# Patient Record
Sex: Male | Born: 1985 | Race: Black or African American | Hispanic: No | Marital: Single | Smoking: Never smoker
Health system: Southern US, Community
[De-identification: ages and names within clinical notes are randomized; demographics above are authoritative.]

## PROBLEM LIST (undated history)

## (undated) DIAGNOSIS — G4730 Sleep apnea, unspecified: Secondary | ICD-10-CM

## (undated) DIAGNOSIS — N200 Calculus of kidney: Secondary | ICD-10-CM

## (undated) DIAGNOSIS — I10 Essential (primary) hypertension: Secondary | ICD-10-CM

## (undated) HISTORY — PX: INGUINAL HERNIA REPAIR: SUR1180

## (undated) HISTORY — DX: Calculus of kidney: N20.0

## (undated) HISTORY — DX: Sleep apnea, unspecified: G47.30

## (undated) HISTORY — DX: Essential (primary) hypertension: I10

---
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
## 2006-08-27 ENCOUNTER — Emergency Department (HOSPITAL_COMMUNITY): Admission: EM | Admit: 2006-08-27 | Discharge: 2006-08-27 | Payer: Self-pay | Admitting: Emergency Medicine

## 2006-09-30 ENCOUNTER — Ambulatory Visit: Payer: Self-pay | Admitting: Nurse Practitioner

## 2006-10-01 ENCOUNTER — Ambulatory Visit: Payer: Self-pay | Admitting: *Deleted

## 2007-02-10 ENCOUNTER — Ambulatory Visit: Payer: Self-pay | Admitting: Nurse Practitioner

## 2007-07-29 ENCOUNTER — Ambulatory Visit: Payer: Self-pay | Admitting: Internal Medicine

## 2007-07-30 ENCOUNTER — Encounter (INDEPENDENT_AMBULATORY_CARE_PROVIDER_SITE_OTHER): Payer: Self-pay | Admitting: *Deleted

## 2007-08-13 ENCOUNTER — Ambulatory Visit: Payer: Self-pay | Admitting: Internal Medicine

## 2008-02-13 ENCOUNTER — Ambulatory Visit: Payer: Self-pay | Admitting: Internal Medicine

## 2008-08-10 ENCOUNTER — Encounter (INDEPENDENT_AMBULATORY_CARE_PROVIDER_SITE_OTHER): Payer: Self-pay | Admitting: Family Medicine

## 2008-08-10 ENCOUNTER — Ambulatory Visit: Payer: Self-pay | Admitting: Internal Medicine

## 2008-08-10 LAB — CONVERTED CEMR LAB: Helicobacter Pylori Antibody-IgG: 0.5

## 2008-09-07 ENCOUNTER — Encounter (INDEPENDENT_AMBULATORY_CARE_PROVIDER_SITE_OTHER): Payer: Self-pay | Admitting: Internal Medicine

## 2008-09-07 ENCOUNTER — Ambulatory Visit: Payer: Self-pay | Admitting: Internal Medicine

## 2008-09-07 LAB — CONVERTED CEMR LAB
ALT: 21 units/L (ref 0–53)
AST: 19 units/L (ref 0–37)
Albumin: 4.6 g/dL (ref 3.5–5.2)
Alkaline Phosphatase: 98 units/L (ref 39–117)
Amphetamine Screen, Ur: NEGATIVE
BUN: 14 mg/dL (ref 6–23)
Barbiturate Quant, Ur: NEGATIVE
Basophils Absolute: 0 10*3/uL (ref 0.0–0.1)
Basophils Relative: 0 % (ref 0–1)
Benzodiazepines.: NEGATIVE
CO2: 28 meq/L (ref 19–32)
Calcium: 10.2 mg/dL (ref 8.4–10.5)
Chloride: 103 meq/L (ref 96–112)
Cocaine Metabolites: NEGATIVE
Creatinine, Ser: 1.01 mg/dL (ref 0.40–1.50)
Creatinine,U: 354.7 mg/dL
Eosinophils Absolute: 0 10*3/uL (ref 0.0–0.7)
Eosinophils Relative: 1 % (ref 0–5)
Glucose, Bld: 83 mg/dL (ref 70–99)
HCT: 46.2 % (ref 39.0–52.0)
Hemoglobin: 15.5 g/dL (ref 13.0–17.0)
Lymphocytes Relative: 38 % (ref 12–46)
Lymphs Abs: 1.7 10*3/uL (ref 0.7–4.0)
MCHC: 33.5 g/dL (ref 30.0–36.0)
MCV: 92.2 fL (ref 78.0–100.0)
Marijuana Metabolite: NEGATIVE
Methadone: NEGATIVE
Monocytes Absolute: 0.5 10*3/uL (ref 0.1–1.0)
Monocytes Relative: 12 % (ref 3–12)
Neutro Abs: 2.1 10*3/uL (ref 1.7–7.7)
Neutrophils Relative %: 49 % (ref 43–77)
Opiate Screen, Urine: NEGATIVE
Phencyclidine (PCP): NEGATIVE
Platelets: 218 10*3/uL (ref 150–400)
Potassium: 4.4 meq/L (ref 3.5–5.3)
Propoxyphene: NEGATIVE
RBC: 5.01 M/uL (ref 4.22–5.81)
RDW: 12.4 % (ref 11.5–15.5)
Sodium: 141 meq/L (ref 135–145)
Total Bilirubin: 0.8 mg/dL (ref 0.3–1.2)
Total Protein: 7.1 g/dL (ref 6.0–8.3)
WBC: 4.3 10*3/uL (ref 4.0–10.5)

## 2008-10-12 ENCOUNTER — Ambulatory Visit: Payer: Self-pay | Admitting: Internal Medicine

## 2009-09-12 ENCOUNTER — Ambulatory Visit: Payer: Self-pay | Admitting: Family Medicine

## 2009-10-18 ENCOUNTER — Ambulatory Visit: Payer: Self-pay | Admitting: Internal Medicine

## 2010-01-05 ENCOUNTER — Ambulatory Visit: Payer: Self-pay | Admitting: Internal Medicine

## 2012-10-05 ENCOUNTER — Emergency Department (HOSPITAL_COMMUNITY): Payer: Self-pay

## 2012-10-05 ENCOUNTER — Encounter (HOSPITAL_COMMUNITY): Payer: Self-pay | Admitting: Emergency Medicine

## 2012-10-05 ENCOUNTER — Emergency Department (HOSPITAL_COMMUNITY)
Admission: EM | Admit: 2012-10-05 | Discharge: 2012-10-05 | Disposition: A | Discharge status: Home / Self Care | Payer: Self-pay | Attending: Emergency Medicine | Admitting: Emergency Medicine

## 2012-10-05 DIAGNOSIS — N23 Unspecified renal colic: Secondary | ICD-10-CM | POA: Insufficient documentation

## 2012-10-05 DIAGNOSIS — R142 Eructation: Secondary | ICD-10-CM | POA: Insufficient documentation

## 2012-10-05 DIAGNOSIS — R141 Gas pain: Secondary | ICD-10-CM | POA: Insufficient documentation

## 2012-10-05 DIAGNOSIS — R1110 Vomiting, unspecified: Secondary | ICD-10-CM | POA: Insufficient documentation

## 2012-10-05 LAB — CBC WITH DIFFERENTIAL/PLATELET
Basophils Absolute: 0 10*3/uL (ref 0.0–0.1)
Basophils Relative: 0 % (ref 0–1)
Eosinophils Absolute: 0 10*3/uL (ref 0.0–0.7)
Eosinophils Relative: 0 % (ref 0–5)
HCT: 38.9 % — ABNORMAL LOW (ref 39.0–52.0)
Hemoglobin: 13.6 g/dL (ref 13.0–17.0)
Lymphocytes Relative: 14 % (ref 12–46)
Lymphs Abs: 1.9 10*3/uL (ref 0.7–4.0)
MCH: 30.8 pg (ref 26.0–34.0)
MCHC: 35 g/dL (ref 30.0–36.0)
MCV: 88.2 fL (ref 78.0–100.0)
Monocytes Absolute: 0.7 10*3/uL (ref 0.1–1.0)
Monocytes Relative: 5 % (ref 3–12)
Neutro Abs: 10.7 10*3/uL — ABNORMAL HIGH (ref 1.7–7.7)
Neutrophils Relative %: 80 % — ABNORMAL HIGH (ref 43–77)
Platelets: 223 10*3/uL (ref 150–400)
RBC: 4.41 MIL/uL (ref 4.22–5.81)
RDW: 11.9 % (ref 11.5–15.5)
WBC: 13.3 10*3/uL — ABNORMAL HIGH (ref 4.0–10.5)

## 2012-10-05 LAB — COMPREHENSIVE METABOLIC PANEL
ALT: 55 U/L — ABNORMAL HIGH (ref 0–53)
AST: 29 U/L (ref 0–37)
Albumin: 4.1 g/dL (ref 3.5–5.2)
Alkaline Phosphatase: 98 U/L (ref 39–117)
BUN: 20 mg/dL (ref 6–23)
CO2: 25 mEq/L (ref 19–32)
Calcium: 9.6 mg/dL (ref 8.4–10.5)
Chloride: 99 mEq/L (ref 96–112)
Creatinine, Ser: 1.21 mg/dL (ref 0.50–1.35)
GFR calc Af Amer: >90 mL/min (ref >90)
GFR calc non Af Amer: 82 mL/min — ABNORMAL LOW (ref >90)
Glucose, Bld: 153 mg/dL — ABNORMAL HIGH (ref 70–99)
Potassium: 3.4 mEq/L — ABNORMAL LOW (ref 3.5–5.1)
Sodium: 135 mEq/L (ref 135–145)
Total Bilirubin: 0.3 mg/dL (ref 0.3–1.2)
Total Protein: 7 g/dL (ref 6.0–8.3)

## 2012-10-05 LAB — URINALYSIS, ROUTINE W REFLEX MICROSCOPIC
Bilirubin Urine: NEGATIVE
Hgb urine dipstick: NEGATIVE
Specific Gravity, Urine: 1.027 (ref 1.005–1.030)
Urobilinogen, UA: 0.2 mg/dL (ref 0.0–1.0)

## 2012-10-05 LAB — RAPID URINE DRUG SCREEN, HOSP PERFORMED
Barbiturates: NOT DETECTED
Benzodiazepines: NOT DETECTED
Cocaine: NOT DETECTED
Tetrahydrocannabinol: NOT DETECTED

## 2012-10-05 MED ORDER — SODIUM CHLORIDE 0.9 % IV BOLUS (SEPSIS)
1000.0000 mL | Freq: Once | INTRAVENOUS | Status: AC
  Administered 2012-10-05: 1000 mL via INTRAVENOUS

## 2012-10-05 MED ORDER — IBUPROFEN 600 MG PO TABS: 600.0000 mg | ORAL_TABLET | Freq: Four times a day (QID) | ORAL | Status: DC | PRN

## 2012-10-05 MED ORDER — ONDANSETRON HCL 4 MG PO TABS: 4.0000 mg | ORAL_TABLET | Freq: Four times a day (QID) | ORAL | Status: DC

## 2012-10-05 MED ORDER — OXYCODONE-ACETAMINOPHEN 5-325 MG PO TABS: 1.0000 | ORAL_TABLET | ORAL | Status: DC | PRN

## 2012-10-05 MED ORDER — DICYCLOMINE HCL 10 MG/ML IM SOLN
20.0000 mg | Freq: Once | INTRAMUSCULAR | Status: AC
  Administered 2012-10-05: 20 mg via INTRAMUSCULAR
  Filled 2012-10-05: qty 2

## 2012-10-05 MED ORDER — MORPHINE SULFATE 4 MG/ML IJ SOLN
4.0000 mg | Freq: Once | INTRAMUSCULAR | Status: AC
  Administered 2012-10-05: 4 mg via INTRAVENOUS
  Filled 2012-10-05: qty 1

## 2012-10-05 MED ORDER — ONDANSETRON HCL 4 MG/2ML IJ SOLN
4.0000 mg | Freq: Once | INTRAMUSCULAR | Status: AC
  Administered 2012-10-05: 4 mg via INTRAVENOUS
  Filled 2012-10-05: qty 2

## 2012-10-05 MED ORDER — IOHEXOL 300 MG/ML  SOLN
100.0000 mL | Freq: Once | INTRAMUSCULAR | Status: AC | PRN
  Administered 2012-10-05: 100 mL via INTRAVENOUS

## 2012-10-05 NOTE — ED Notes (Signed)
Pt c/o severe abdominal pain that woke him from his sleep 3 hours ago. EMS related pt was standing out in yard upon their arrival. Pt states nausea w/o emesis, last normal BM last night. Vital signs per EMs - BP - 132/74, HR - 82, Resp - 16. Walked to EMS truck, upon arrival to ED laid down on floor of truck. Pt is now rolling from side to side in triage chair

## 2012-10-05 NOTE — ED Notes (Signed)
Reports onset 5:30am  abdominal pain rated 10/10, vomitedx. Patient is moving all over the bed. Last BM last night.

## 2012-10-05 NOTE — ED Provider Notes (Signed)
History     CSN: [id number]  Arrival date & time 10/05/12  4098   First MD Initiated Contact with Patient 10/05/12 [phone]      Chief Complaint  Patient presents with  . Abdominal Pain    (Consider location/radiation/quality/duration/timing/severity/associated sxs/prior treatment) HPI Pt is poor historian and vague with details regarding his presentation. He c/o generalized abdominal pain that is constant and acute onset 3 hours before presentation. Took no OTC meds. Called EMS and laid in the floor of their truck rolling around. No fever or chills. States he vomited x 2 but no current nausea. Last BM was yesterday. No blood in stool or vomit. No urinary changes. No testicular pain History reviewed. No pertinent past medical history.  History reviewed. No pertinent past surgical history.  No family history on file.  History  Substance Use Topics  . Smoking status: Never Smoker   . Smokeless tobacco: Never Used  . Alcohol Use: Yes     Comment: 3 times monthly      Review of Systems  Constitutional: Negative for fever and chills.  Respiratory: Negative for shortness of breath.   Cardiovascular: Negative for chest pain.  Gastrointestinal: Positive for vomiting, abdominal pain and abdominal distention. Negative for nausea, diarrhea, constipation and blood in stool.  Genitourinary: Negative for dysuria, hematuria, flank pain and testicular pain.  Musculoskeletal: Negative for back pain.  Skin: Negative for rash and wound.  Neurological: Negative for weakness and numbness.    Allergies  Review of patient's allergies indicates no known allergies.  Home Medications   Current Outpatient Rx  Name  Route  Sig  Dispense  Refill  . IBUPROFEN 600 MG PO TABS   Oral   Take 1 tablet (600 mg total) by mouth every 6 (six) hours as needed for pain.   30 tablet   0   . ONDANSETRON HCL 4 MG PO TABS   Oral   Take 1 tablet (4 mg total) by mouth every 6 (six) hours.   12 tablet    0   . OXYCODONE-ACETAMINOPHEN 5-325 MG PO TABS   Oral   Take 1 tablet by mouth every 4 (four) hours as needed for pain.   15 tablet   0     BP 133/100  Pulse 95  Temp 97.5 F (36.4 C) (Oral)  Resp 18  SpO2 98%  Physical Exam  Nursing note and vitals reviewed. Constitutional: He is oriented to person, place, and time. He appears well-developed and well-nourished. No distress.  HENT:  Head: Normocephalic and atraumatic.  Mouth/Throat: Oropharynx is clear and moist.  Eyes: EOM are normal. Pupils are equal, round, and reactive to light.  Neck: Normal range of motion. Neck supple.  Cardiovascular: Normal rate and regular rhythm.   Pulmonary/Chest: Effort normal and breath sounds normal. No respiratory distress. He has no wheezes. He has no rales.  Abdominal: Soft. Bowel sounds are normal. He exhibits distension. He exhibits no mass. There is tenderness (generalized TTP). There is no rebound and no guarding.  Musculoskeletal: Normal range of motion. He exhibits no edema and no tenderness.  Neurological: He is alert and oriented to person, place, and time.       Moves all ext without deficit  Skin: Skin is warm and dry. No rash noted. No erythema.    ED Course  Procedures (including critical care time)  Labs Reviewed  CBC WITH DIFFERENTIAL - Abnormal; Notable for the following:    WBC 13.3 (*)  HCT 38.9 (*)     Neutrophils Relative 80 (*)     Neutro Abs 10.7 (*)     All other components within normal limits  COMPREHENSIVE METABOLIC PANEL - Abnormal; Notable for the following:    Potassium 3.4 (*)     Glucose, Bld 153 (*)     ALT 55 (*)     GFR calc non Af Amer 82 (*)     All other components within normal limits  URINALYSIS, ROUTINE W REFLEX MICROSCOPIC - Abnormal; Notable for the following:    APPearance HAZY (*)     Glucose, UA 100 (*)     Ketones, ur TRACE (*)     All other components within normal limits  URINE RAPID DRUG SCREEN (HOSP PERFORMED) - Abnormal;  Notable for the following:    Opiates POSITIVE (*)     All other components within normal limits  LIPASE, BLOOD  URINE MICROSCOPIC-ADD ON   Ct Abdomen Pelvis W Contrast  10/05/2012  *RADIOLOGY REPORT*  Clinical Data: Nausea.  Elevated white blood cell count.  CT ABDOMEN AND PELVIS WITH CONTRAST  Technique:  Multidetector CT imaging of the abdomen and pelvis was performed following the standard protocol during bolus administration of intravenous contrast.  Contrast: OMNIPAQUE IOHEXOL 300 MG/ML  SOLN  Comparison: Plain film earlier today.  No prior CT.  Findings: Lung bases:  Clear lung bases.  Normal heart size without pericardial or pleural effusion.  The distal esophagus is contrast filled.  Abdomen/pelvis:  Mild hepatic steatosis, without focal liver lesion.  Normal spleen, stomach, pancreas, gallbladder, biliary tract, adrenal glands, left kidney.  Mild obstructive signs involve the right kidney and ureter.  These continue to the level of a right ureterovesicular junction 3 mm stone on image 73/series 2.  There is edema at the ureteric orifice.  No retroperitoneal or retrocrural adenopathy.  Normal colon, appendix, and terminal ileum.  Normal small bowel without abdominal ascites.  No pelvic adenopathy.    Normal prostate, without significant free pelvic fluid.  Bones/Musculoskeletal:  No acute osseous abnormality.  IMPRESSION: 1.  Mild right-sided urinary tract obstruction, secondary to a 3 mm stone at the right ureterovesicular junction. 2.  Contrast in the distal esophagus, suggesting dysmotility or gastroesophageal reflux disease. 3.  Probable mild hepatic steatosis.   Original Report Authenticated By: Jeronimo Greaves, M.D.    Dg Abd Acute W/chest  10/05/2012  *RADIOLOGY REPORT*  Clinical Data: Abdominal pain.  ACUTE ABDOMEN SERIES (ABDOMEN 2 VIEW & CHEST 1 VIEW)  Comparison: None.  Findings: Lungs are clear.  Heart size is normal.  Abdominal films show no evidence of overt bowel obstruction or  free air.  There is a minimally prominent single loop of jejunum in the left mid to upper abdomen which may reflect focal ileus/enteritis.  No abnormal calcifications.  Bony structures are unremarkable.  IMPRESSION: No significant abnormalities.  Potential focal ileus/enteritis involving a loop of proximal jejunum.   Original Report Authenticated By: Irish Lack, M.D.      1. Renal colic on right side       MDM   Pt lying comfortably in bed. States he is feeling much better. Will d/c home with pain meds and urology referral as needed. Pt instructed to return for worsening pain, fever or any concerns       Loren Racer, MD 10/05/12 1102

## 2015-09-11 ENCOUNTER — Ambulatory Visit (HOSPITAL_BASED_OUTPATIENT_CLINIC_OR_DEPARTMENT_OTHER): Payer: Self-pay

## 2016-08-06 DIAGNOSIS — J343 Hypertrophy of nasal turbinates: Secondary | ICD-10-CM | POA: Insufficient documentation

## 2020-04-08 ENCOUNTER — Other Ambulatory Visit: Payer: Self-pay | Admitting: Emergency Medicine

## 2020-04-08 DIAGNOSIS — R1084 Generalized abdominal pain: Secondary | ICD-10-CM

## 2020-04-19 ENCOUNTER — Other Ambulatory Visit: Payer: Self-pay | Admitting: Emergency Medicine

## 2020-04-20 ENCOUNTER — Ambulatory Visit
Admission: RE | Admit: 2020-04-20 | Discharge: 2020-04-20 | Disposition: A | Discharge status: Home / Self Care | Payer: No Typology Code available for payment source | Source: Ambulatory Visit | Attending: Emergency Medicine | Admitting: Emergency Medicine

## 2020-04-20 DIAGNOSIS — R1084 Generalized abdominal pain: Secondary | ICD-10-CM

## 2021-06-02 ENCOUNTER — Emergency Department (HOSPITAL_COMMUNITY)
Admission: EM | Admit: 2021-06-02 | Discharge: 2021-06-02 | Disposition: A | Discharge status: Home / Self Care | Payer: Self-pay | Attending: Emergency Medicine | Admitting: Emergency Medicine

## 2021-06-02 ENCOUNTER — Encounter (HOSPITAL_COMMUNITY): Payer: Self-pay

## 2021-06-02 ENCOUNTER — Other Ambulatory Visit: Payer: Self-pay

## 2021-06-02 ENCOUNTER — Emergency Department (HOSPITAL_COMMUNITY): Payer: Self-pay

## 2021-06-02 DIAGNOSIS — N201 Calculus of ureter: Secondary | ICD-10-CM | POA: Insufficient documentation

## 2021-06-02 LAB — URINALYSIS, ROUTINE W REFLEX MICROSCOPIC
Bacteria, UA: NONE SEEN
Bilirubin Urine: NEGATIVE
Glucose, UA: NEGATIVE mg/dL
Ketones, ur: 5 mg/dL — AB
Leukocytes,Ua: NEGATIVE
Nitrite: NEGATIVE
Protein, ur: NEGATIVE mg/dL
RBC / HPF: >50 RBC/hpf — ABNORMAL HIGH (ref 0–5)
Specific Gravity, Urine: 1.02 (ref 1.005–1.030)
pH: 6 (ref 5.0–8.0)

## 2021-06-02 MED ORDER — ONDANSETRON 4 MG PO TBDP: 4.0000 mg | ORAL_TABLET | Freq: Three times a day (TID) | ORAL | 0 refills | Status: DC | PRN

## 2021-06-02 MED ORDER — ONDANSETRON HCL 4 MG/2ML IJ SOLN
4.0000 mg | Freq: Once | INTRAMUSCULAR | Status: AC
  Administered 2021-06-02: 4 mg via INTRAVENOUS
  Filled 2021-06-02: qty 2

## 2021-06-02 MED ORDER — HYDROMORPHONE HCL 1 MG/ML IJ SOLN
1.0000 mg | Freq: Once | INTRAMUSCULAR | Status: AC
  Administered 2021-06-02: 1 mg via INTRAVENOUS
  Filled 2021-06-02: qty 1

## 2021-06-02 MED ORDER — HYDROCODONE-ACETAMINOPHEN 5-325 MG PO TABS: 1.0000 | ORAL_TABLET | ORAL | 0 refills | Status: DC | PRN

## 2021-06-02 MED ORDER — TAMSULOSIN HCL 0.4 MG PO CAPS: 0.4000 mg | ORAL_CAPSULE | Freq: Every day | ORAL | 0 refills | Status: DC

## 2021-06-02 NOTE — Discharge Instructions (Addendum)
You have a small stone that should pass without difficulty. Take medications as prescribed. If you develop a fever, have uncontrolled pain or vomiting, return to the ED for further evaluation.

## 2021-06-02 NOTE — ED Provider Notes (Signed)
[facility]-EMERGENCY DEPT Provider Note   CSN: [id number] Arrival date & time: 06/02/21  4287     History Chief Complaint  Patient presents with   Abdominal Pain    Garrett George is a 35 y.o. male.  Patient to ED with sudden onset RLQ abdominal pain that radiates the to groin area. He reports he has a history of kidney stones that felt similarly. No fever. No flank pain. No testicular pain or swelling. He reports nausea with vomiting.   The history is provided by the patient. No language interpreter was used.  Abdominal Pain Associated symptoms: no chills and no fever       History reviewed. No pertinent past medical history.  There are no problems to display for this patient.   History reviewed. No pertinent surgical history.     No family history on file.  Social History   Tobacco Use   Smoking status: Never   Smokeless tobacco: Never  Substance Use Topics   Alcohol use: Yes    Comment: 3 times monthly   Drug use: No    Home Medications Prior to Admission medications   Medication Sig Start Date End Date Taking? Authorizing Provider  ibuprofen (ADVIL,MOTRIN) 600 MG tablet Take 1 tablet (600 mg total) by mouth every 6 (six) hours as needed for pain. 10/05/12   Loren Racer, MD  ondansetron (ZOFRAN) 4 MG tablet Take 1 tablet (4 mg total) by mouth every 6 (six) hours. 10/05/12   Loren Racer, MD  oxyCODONE-acetaminophen (PERCOCET/ROXICET) 5-325 MG per tablet Take 1 tablet by mouth every 4 (four) hours as needed for pain. 10/05/12   Loren Racer, MD    Allergies    Patient has no known allergies.  Review of Systems   Review of Systems  Constitutional:  Negative for chills and fever.  HENT: Negative.    Respiratory: Negative.    Cardiovascular: Negative.   Gastrointestinal:  Positive for abdominal pain.  Musculoskeletal: Negative.   Skin: Negative.   Neurological: Negative.    Physical Exam Updated Vital Signs BP (!)  125/99 (BP Location: Left Arm)   Pulse 85   Temp 98.4 F (36.9 C) (Oral)   Resp 16   Ht 5\' 9"  (1.753 m)   Wt 83.9 kg   SpO2 100%   BMI 27.32 kg/m   Physical Exam Vitals and nursing note reviewed.  Constitutional:      Appearance: He is well-developed.  Pulmonary:     Effort: Pulmonary effort is normal.  Abdominal:     Tenderness: There is abdominal tenderness (Very mild RLQ tenderness.). There is no right CVA tenderness.  Genitourinary:    Penis: Normal.      Testes: Normal.        Right: Tenderness or swelling not present.        Left: Tenderness or swelling not present.  Musculoskeletal:        General: Normal range of motion.     Cervical back: Normal range of motion.  Skin:    General: Skin is warm and dry.  Neurological:     Mental Status: He is alert and oriented to person, place, and time.    ED Results / Procedures / Treatments   Labs (all labs ordered are listed, but only abnormal results are displayed) Labs Reviewed  URINALYSIS, ROUTINE W REFLEX MICROSCOPIC    EKG None  Radiology No results found. CT Renal Stone Study  Result Date: 06/02/2021 CLINICAL DATA:  Right lower  quadrant pain, nausea, EXAM: CT ABDOMEN AND PELVIS WITHOUT CONTRAST TECHNIQUE: Multidetector CT imaging of the abdomen and pelvis was performed following the standard protocol without IV contrast. COMPARISON:  10/05/2012 FINDINGS: Lower chest: Minimal bibasilar dependent atelectasis. The visualized heart and pericardium are unremarkable. Hepatobiliary: No focal liver abnormality is seen. No gallstones, gallbladder wall thickening, or biliary dilatation. Pancreas: Unremarkable Spleen: Unremarkable Adrenals/Urinary Tract: The adrenal glands are unremarkable. The kidneys are normal in size and position. There is minimal right hydronephrosis and hydroureter secondary to an obstructing 2 mm calculus at the right ureterovesicular junction protruding into the bladder lumen, best appreciated on coronal  image # 76. No additional nephro or urolithiasis. No hydronephrosis on the left. The bladder is decompressed and is otherwise unremarkable. Stomach/Bowel: Stomach is within normal limits. Appendix appears normal. No evidence of bowel wall thickening, distention, or inflammatory changes. No free intraperitoneal gas or fluid. Vascular/Lymphatic: No significant vascular findings are present. No enlarged abdominal or pelvic lymph nodes. Reproductive: Prostate is unremarkable. Other: No abdominal wall hernia.  Rectum unremarkable. Musculoskeletal: No acute or significant osseous findings. IMPRESSION: Mild left hydronephrosis secondary to an obstructing 2 mm calculus at the right ureterovesicular junction. Electronically Signed   By: Helyn Numbers MD   On: 06/02/2021 04:58    Procedures Procedures   Medications Ordered in ED Medications  HYDROmorphone (DILAUDID) injection 1 mg (1 mg Intravenous Given 06/02/21 0411)  ondansetron (ZOFRAN) injection 4 mg (4 mg Intravenous Given 06/02/21 0411)    ED Course  I have reviewed the triage vital signs and the nursing notes.  Pertinent labs & imaging results that were available during my care of the patient were reviewed by me and considered in my medical decision making (see chart for details).    MDM Rules/Calculators/A&P                           Patient to ED with RLQ abdominal pain radiating to groin that started just prior to arrival. No fever. History of kidney stones  Patient appears uncomfortable. No testicular tenderness to suggest testicular torsion. Sudden onset, only minimal rlq tenderness, doubt appy. UA, CT renal pending.   Pain is improved with IV pain medication. No further nausea. CT shows 2 mm distal right ureteral stone. Discussed discharge with the patient who states he would like to make sure there is no infection, however, unable to produce specimen. Will do in and out cath if he continues to be unable to provide urine.   UA negative  for infection. He is appropriate for discharge home.    Final Clinical Impression(s) / ED Diagnoses Final diagnoses:  None   Right ureteral stone  Rx / DC Orders ED Discharge Orders     None        Elpidio Anis, PA-C 06/02/21 0651    Palumbo, April, MD 06/02/21 [phone]

## 2021-06-02 NOTE — ED Triage Notes (Signed)
Pt reports RLQ abdominal pain beginning 45 minutes prior to arrival. Pt endorses nausea and a sharp cramping sensation.

## 2022-05-25 ENCOUNTER — Encounter (HOSPITAL_COMMUNITY): Payer: Self-pay

## 2022-05-25 ENCOUNTER — Encounter: Payer: Self-pay | Admitting: Physician Assistant

## 2022-05-25 ENCOUNTER — Other Ambulatory Visit: Payer: Self-pay

## 2022-05-25 ENCOUNTER — Emergency Department (HOSPITAL_COMMUNITY)
Admission: EM | Admit: 2022-05-25 | Discharge: 2022-05-26 | Disposition: A | Discharge status: Home / Self Care | Payer: Commercial Managed Care - PPO | Attending: Emergency Medicine | Admitting: Emergency Medicine

## 2022-05-25 DIAGNOSIS — R1084 Generalized abdominal pain: Secondary | ICD-10-CM | POA: Diagnosis present

## 2022-05-25 DIAGNOSIS — R1032 Left lower quadrant pain: Secondary | ICD-10-CM | POA: Insufficient documentation

## 2022-05-25 DIAGNOSIS — R319 Hematuria, unspecified: Secondary | ICD-10-CM | POA: Insufficient documentation

## 2022-05-25 NOTE — ED Triage Notes (Signed)
Pt reports with lower abdominal pain x 4 days. He reports having blood in his stool recently but now it is mucus.

## 2022-05-25 NOTE — ED Notes (Signed)
Pt ambulatory to restroom w/out assistance. Specimen cup provided and pt was instructed to try and give urine sample.

## 2022-05-26 ENCOUNTER — Emergency Department (HOSPITAL_COMMUNITY): Payer: Commercial Managed Care - PPO

## 2022-05-26 LAB — URINALYSIS, ROUTINE W REFLEX MICROSCOPIC
Bilirubin Urine: NEGATIVE
Glucose, UA: NEGATIVE mg/dL
Ketones, ur: 5 mg/dL — AB
Leukocytes,Ua: NEGATIVE
Nitrite: NEGATIVE
Protein, ur: NEGATIVE mg/dL
RBC / HPF: >50 RBC/hpf — ABNORMAL HIGH (ref 0–5)
Specific Gravity, Urine: 1.02 (ref 1.005–1.030)
pH: 5 (ref 5.0–8.0)

## 2022-05-26 LAB — COMPREHENSIVE METABOLIC PANEL
ALT: 16 U/L (ref 0–44)
AST: 18 U/L (ref 15–41)
Albumin: 4.7 g/dL (ref 3.5–5.0)
Alkaline Phosphatase: 87 U/L (ref 38–126)
Anion gap: 9 (ref 5–15)
BUN: 10 mg/dL (ref 6–20)
CO2: 26 mmol/L (ref 22–32)
Calcium: 10.1 mg/dL (ref 8.9–10.3)
Chloride: 108 mmol/L (ref 98–111)
Creatinine, Ser: 1.06 mg/dL (ref 0.61–1.24)
GFR, Estimated: >60 mL/min (ref >60)
Glucose, Bld: 91 mg/dL (ref 70–99)
Potassium: 4 mmol/L (ref 3.5–5.1)
Sodium: 143 mmol/L (ref 135–145)
Total Bilirubin: 0.9 mg/dL (ref 0.3–1.2)
Total Protein: 7.6 g/dL (ref 6.5–8.1)

## 2022-05-26 LAB — CBC WITH DIFFERENTIAL/PLATELET
Abs Immature Granulocytes: 0.01 10*3/uL (ref 0.00–0.07)
Basophils Absolute: 0 10*3/uL (ref 0.0–0.1)
Basophils Relative: 1 %
Eosinophils Absolute: 0.1 10*3/uL (ref 0.0–0.5)
Eosinophils Relative: 1 %
HCT: 45.5 % (ref 39.0–52.0)
Hemoglobin: 15.5 g/dL (ref 13.0–17.0)
Immature Granulocytes: 0 %
Lymphocytes Relative: 30 %
Lymphs Abs: 2 10*3/uL (ref 0.7–4.0)
MCH: 30.9 pg (ref 26.0–34.0)
MCHC: 34.1 g/dL (ref 30.0–36.0)
MCV: 90.6 fL (ref 80.0–100.0)
Monocytes Absolute: 0.6 10*3/uL (ref 0.1–1.0)
Monocytes Relative: 9 %
Neutro Abs: 3.9 10*3/uL (ref 1.7–7.7)
Neutrophils Relative %: 59 %
Platelets: 240 10*3/uL (ref 150–400)
RBC: 5.02 MIL/uL (ref 4.22–5.81)
RDW: 12 % (ref 11.5–15.5)
WBC: 6.6 10*3/uL (ref 4.0–10.5)
nRBC: 0 % (ref 0.0–0.2)

## 2022-05-26 LAB — POC OCCULT BLOOD, ED: Fecal Occult Bld: POSITIVE — AB

## 2022-05-26 MED ORDER — KETOROLAC TROMETHAMINE 15 MG/ML IJ SOLN
15.0000 mg | Freq: Once | INTRAMUSCULAR | Status: AC
  Administered 2022-05-26: 15 mg via INTRAVENOUS
  Filled 2022-05-26: qty 1

## 2022-05-26 MED ORDER — IOHEXOL 300 MG/ML  SOLN
100.0000 mL | Freq: Once | INTRAMUSCULAR | Status: AC | PRN
  Administered 2022-05-26: 100 mL via INTRAVENOUS

## 2022-05-26 MED ORDER — SODIUM CHLORIDE (PF) 0.9 % IJ SOLN
INTRAMUSCULAR | Status: AC
  Filled 2022-05-26: qty 50

## 2022-05-26 MED ORDER — DICYCLOMINE HCL 20 MG PO TABS: 20.0000 mg | ORAL_TABLET | Freq: Two times a day (BID) | ORAL | 0 refills | Status: DC | PRN

## 2022-05-26 MED ORDER — METOCLOPRAMIDE HCL 5 MG/ML IJ SOLN
10.0000 mg | Freq: Once | INTRAMUSCULAR | Status: AC
  Administered 2022-05-26: 10 mg via INTRAVENOUS
  Filled 2022-05-26: qty 2

## 2022-05-26 NOTE — ED Provider Notes (Signed)
[facility]-EMERGENCY DEPT Provider Note   CSN: [id number] Arrival date & time: 05/25/22  2253     History  Chief Complaint  Patient presents with   Abdominal Pain    Garrett George is a 36 y.o. male  who presents with concern for 2 weeks of generalized abdominal pain worse in the left lower quadrant with associated bleeding in his stool with bowel movements.  Endorses bright red blood but also endorses melena.  Does endorse that he initially had some firm stools that he had to strain to pass but since that time his stools been very soft.  No bleeding in the bowel movements for the last 2 days since patient has been on oral amoxicillin as prescribed by outpatient urgent care provider.  Unfortunately I cannot see these notes in his room medical records.  He does have an appointment scheduled for August 14 at 230 with [facility] gastroenterology for further evaluation.  He denies any weight changes, fevers, chills, endorses history of hernia repair.  Denies any history of smoking or known family history of malignancy.  HPI     Home Medications Prior to Admission medications   Medication Sig Start Date End Date Taking? Authorizing Provider  amoxicillin-clavulanate (AUGMENTIN) 875-125 MG tablet Take 1 tablet by mouth every 8 (eight) hours. 05/22/22  Yes [provider]  Casanthranol-Docusate Sodium (STOOL SOFTENER PLUS PO) Take 1 tablet by mouth as needed (constipation).   Yes [provider]  dicyclomine (BENTYL) 20 MG tablet Take 1 tablet (20 mg total) by mouth 2 (two) times daily as needed for spasms. 05/26/22  Yes Josephine Rudnick, Eugene Gavia, PA-C  HYDROcodone-acetaminophen (NORCO/VICODIN) 5-325 MG tablet Take 1 tablet by mouth every 4 (four) hours as needed. Patient not taking: Reported on 05/25/2022 06/02/21   Elpidio Anis, PA-C  ibuprofen (ADVIL,MOTRIN) 600 MG tablet Take 1 tablet (600 mg total) by mouth every 6 (six) hours as needed for pain. Patient not  taking: Reported on 05/25/2022 10/05/12   Loren Racer, MD  ondansetron (ZOFRAN ODT) 4 MG disintegrating tablet Take 1 tablet (4 mg total) by mouth every 8 (eight) hours as needed for nausea or vomiting. Patient not taking: Reported on 05/25/2022 06/02/21   Elpidio Anis, PA-C  ondansetron (ZOFRAN) 4 MG tablet Take 1 tablet (4 mg total) by mouth every 6 (six) hours. Patient not taking: Reported on 05/25/2022 10/05/12   Loren Racer, MD  oxyCODONE-acetaminophen (PERCOCET/ROXICET) 5-325 MG per tablet Take 1 tablet by mouth every 4 (four) hours as needed for pain. Patient not taking: Reported on 05/25/2022 10/05/12   Loren Racer, MD  tamsulosin [facility]) 0.4 MG CAPS capsule Take 1 capsule (0.4 mg total) by mouth daily after breakfast. Patient not taking: Reported on 05/25/2022 06/02/21   Elpidio Anis, PA-C      Allergies    Patient has no known allergies.    Review of Systems   Review of Systems  Constitutional: Negative.   HENT: Negative.    Eyes: Negative.   Respiratory: Negative.    Cardiovascular: Negative.   Gastrointestinal:  Positive for abdominal pain, blood in stool and constipation.  Genitourinary: Negative.   Musculoskeletal: Negative.   Skin: Negative.   Neurological: Negative.   Hematological: Negative.     Physical Exam Updated Vital Signs BP (!) 123/92   Pulse 79   Temp 98.7 F (37.1 C) (Oral)   Resp 18   Ht 5\' 9"  (1.753 m)   Wt 83 kg   SpO2 96%  BMI 27.02 kg/m  Physical Exam Vitals and nursing note reviewed.  Constitutional:      Appearance: He is not ill-appearing or toxic-appearing.  HENT:     Head: Normocephalic and atraumatic.     Nose: Nose normal.     Mouth/Throat:     Mouth: Mucous membranes are moist.     Pharynx: Oropharynx is clear. Uvula midline. No oropharyngeal exudate or posterior oropharyngeal erythema.     Tonsils: No tonsillar exudate.  Eyes:     General: Lids are normal. Vision grossly intact.        Right eye: No  discharge.        Left eye: No discharge.     Conjunctiva/sclera: Conjunctivae normal.     Pupils: Pupils are equal, round, and reactive to light.  Neck:     Trachea: Phonation normal. No tracheostomy.  Cardiovascular:     Rate and Rhythm: Normal rate and regular rhythm.     Pulses: Normal pulses.     Heart sounds: Normal heart sounds. No murmur heard. Pulmonary:     Effort: Pulmonary effort is normal. No respiratory distress.     Breath sounds: Normal breath sounds. No wheezing or rales.  Chest:     Chest wall: No mass, lacerations, deformity, swelling, tenderness or crepitus.  Abdominal:     General: Bowel sounds are normal. There is no distension.     Palpations: Abdomen is soft.     Tenderness: There is generalized abdominal tenderness and tenderness in the left lower quadrant. There is no right CVA tenderness, left CVA tenderness, guarding or rebound.  Genitourinary:    Comments: Hemoccult sample obtained at this time that RN performed DRE.,  I was not present at that time unfortunately therefore unable to evaluate GU anatomy personally.  Repeat exam declined by patient. Musculoskeletal:        General: No deformity.     Cervical back: Normal range of motion and neck supple.     Right lower leg: No edema.     Left lower leg: No edema.  Lymphadenopathy:     Cervical: No cervical adenopathy.  Skin:    General: Skin is warm and dry.     Capillary Refill: Capillary refill takes less than 2 seconds.  Neurological:     General: No focal deficit present.     Mental Status: He is alert and oriented to person, place, and time. Mental status is at baseline.  Psychiatric:        Mood and Affect: Mood normal.     ED Results / Procedures / Treatments   Labs (all labs ordered are listed, but only abnormal results are displayed) Labs Reviewed  URINALYSIS, ROUTINE W REFLEX MICROSCOPIC - Abnormal; Notable for the following components:      Result Value   APPearance HAZY (*)    Hgb  urine dipstick MODERATE (*)    Ketones, ur 5 (*)    RBC / HPF >50 (*)    Bacteria, UA RARE (*)    All other components within normal limits  POC OCCULT BLOOD, ED - Abnormal; Notable for the following components:   Fecal Occult Bld POSITIVE (*)    All other components within normal limits  COMPREHENSIVE METABOLIC PANEL  CBC WITH DIFFERENTIAL/PLATELET    EKG None  Radiology CT Abdomen Pelvis W Contrast  Result Date: 05/26/2022 CLINICAL DATA:  Left lower quadrant abdominal pain. EXAM: CT ABDOMEN AND PELVIS WITH CONTRAST TECHNIQUE: Multidetector CT imaging of the abdomen  and pelvis was performed using the standard protocol following bolus administration of intravenous contrast. RADIATION DOSE REDUCTION: This exam was performed according to the departmental dose-optimization program which includes automated exposure control, adjustment of the mA and/or kV according to patient size and/or use of iterative reconstruction technique. CONTRAST:  OMNIPAQUE IOHEXOL 300 MG/ML  SOLN COMPARISON:  CT abdomen pelvis dated 06/02/2021. FINDINGS: Lower chest: The visualized lung bases are clear. No intra-abdominal free air or free fluid. Hepatobiliary: No focal liver abnormality is seen. No gallstones, gallbladder wall thickening, or biliary dilatation. Pancreas: Unremarkable. No pancreatic ductal dilatation or surrounding inflammatory changes. Spleen: Normal in size without focal abnormality. Adrenals/Urinary Tract: Adrenal glands are unremarkable. Kidneys are normal, without renal calculi, focal lesion, or hydronephrosis. Bladder is unremarkable. Stomach/Bowel: There is no bowel obstruction or active inflammation. The appendix is normal. Vascular/Lymphatic: The abdominal aorta and IVC unremarkable. No portal venous gas. There is no adenopathy. Reproductive: The prostate and seminal vesicles are grossly unremarkable. A cast. Other: None Musculoskeletal: No acute or significant osseous findings. IMPRESSION: No  acute intra-abdominal or pelvic pathology. Electronically Signed   By: Elgie Collard M.D.   On: 05/26/2022 01:45    Procedures Procedures    Medications Ordered in ED Medications  iohexol (OMNIPAQUE) 300 MG/ML solution 100 mL (100 mLs Intravenous Contrast Given 05/26/22 0127)  sodium chloride (PF) 0.9 % injection (  Given by Other 05/26/22 0133)  metoCLOPramide (REGLAN) injection 10 mg (10 mg Intravenous Given 05/26/22 0416)  ketorolac (TORADOL) 15 MG/ML injection 15 mg (15 mg Intravenous Given 05/26/22 0416)    ED Course/ Medical Decision Making/ A&P Clinical Course as of 05/26/22 0654  Sat May 26, 2022  0403 Chloride: 108 [RS]    Clinical Course User Index [RS] Paris Lore, PA-C                           Medical Decision Making 36 year old male who presents with concern for LLQ pain and blood in the stool x 2 weeks.   Tachycardic and hypertensive, VS otherwise normal. Cardiopulmonary exam is normal, abdominal exam with generalized palpation worse in the left lower quadrant though still mild on palpation.  No rebound or guarding.  No CVAT.  Normal bowel sounds, abdomen soft and nondistended.  Differential Nicole Kindred includes not limited to diverticulitis, renal stone, UTI, pyelonephritis, infectious colitis, IBD, mesenteric ischemia, malignancy, bowel obstruction.  Amount and/or Complexity of Data Reviewed Labs: ordered. Decision-making details documented in ED Course.    Details: CBC and CMP unremarkable, lipase is normal.  UA with hemoblobinuria, hx of nephrolithiasis. Radiology: ordered and independent interpretation performed.    Details: CT the abdomen pelvis visualized this provider without acute abdominal pelvic abnormality to explain the patient's symptoms.  Risk Prescription drug management.   While the exact etiology of this patient's abdominal pain remains unclear does not appear to be any emergent source at this time.  Patient's pain is resolved after  administration of Toradol and Reglan in the emergency department.  Will discharge with prescription for Bentyl as needed at home.  He already has a gastroenterology appointment for 06/25/2022 which I encouraged him to keep.  No further work-up is warranted in the emergency department this time.  Clinical concern for emergent source of his symptoms remains low.  Antwain  voiced understanding of his medical evaluation and treatment plan. Each of their questions answered to their expressed satisfaction.  Return precautions were given.  Patient  is well-appearing, stable, and was discharged in good condition.  This chart was dictated using voice recognition software, Dragon. Despite the best efforts of this provider to proofread and correct errors, errors may still occur which can change documentation meaning.   Final Clinical Impression(s) / ED Diagnoses Final diagnoses:  Generalized abdominal pain  Hematuria, unspecified type    Rx / DC Orders ED Discharge Orders          Ordered    dicyclomine (BENTYL) 20 MG tablet  2 times daily PRN        05/26/22 0554              Britanny Marksberry, Eugene Gavia, PA-C 05/26/22 0654    Molpus, Jonny Ruiz, MD 05/26/22 [id number]

## 2022-05-26 NOTE — Discharge Instructions (Signed)
Your CT scan and blood work are very reassuring today.  While the exact cause of your abdominal pain remains unclear there does not appear to be any emergent problem at this time.  Please follow-up with gastroenterologist as previously scheduled for next month.  You are also found to have some blood in your urine, please follow-up with urologist listed below or with your primary care doctor for reevaluation of this.  Return to the ER with any severe symptoms.  You been prescribed medication to take as needed for abdominal cramping.

## 2022-06-22 NOTE — Progress Notes (Unsigned)
06/25/2022 NICASIO BARLOWE [id number] 11-Mar-1986  Referring provider: Otila Kluver, PA-C Primary GI doctor: Dr. Retia Passe  ASSESSMENT AND PLAN:   36 year old male with history of alternating constipation and diarrhea with 2 months of rectal bleeding.   No anemia.  CT abdomen pelvis without evidence of colitis. Declined rectal exam. We will plan for lab evaluation, will send in Anusol cream to apply to Preparation H. - Increase fiber/ water intake, decrease caffeine, increase activity level. -Will add on Miralax daily and Benefiber Due to length of bleeding and AB pain, discussed with patient and will proceed with colonoscopy  We have discussed the risks of bleeding, infection, perforation, medication reactions, and remote risk of death associated with colonoscopy. All questions were answered and the patient acknowledges these risk and wishes to proceed.   OSA (obstructive sleep apnea) Not treated, suggest getting back on CPAP   Patient Care Team: Patient, No Pcp Per as PCP - General (General Practice)  HISTORY OF PRESENT ILLNESS: 36 y.o. male with OSA not on CPAP presents for evaluation of abdominal pain, rectal bleeding.   05/26/2022 CT abdomen pelvis with contrast for left lower quadrant pain unremarkable Labs reviewed from 05/26/2022 show no anemia hemoglobin 15.5, no leukocytosis, normal kidney, Normal liver Has some tachycardia today, states he is anxious.  He has been having AB pain for 2-3 days, got worse so went to the ER.  Had some nausea, no vomiting. No fever, chills.  He goes back and for with diarrhea/constipation x several weeks, he has had intermittent rectal bleeding with this, BRB streaks of blood in toliet, with stool, on TP, has seen some clots/clumps of darker blood.  Was having BM daily but has only had every other day in the last week.  Can have loose stools or hard formed stools.  No rectal pain, burning, itching.  Down about 5 lbs in a month.   Rare ETOH, no smoking, no drugs.  Mathernal GF with colon cancer.  Was similar pain to kidney stone in 2022 without contrast, but negative CT scan WITH contrast.   Wt Readings from Last 3 Encounters:  06/25/22 177 lb 6 oz (80.5 kg)  05/25/22 183 lb (83 kg)  06/02/21 185 lb (83.9 kg)   He  reports that he has never smoked. He has never used smokeless tobacco. He reports current alcohol use. He reports that he does not use drugs. His family history includes Colon cancer in his maternal grandfather; Diabetes in his father and mother; Heart attack in his brother; High Cholesterol in his father and mother; Hypertension in his father and mother; Kidney disease in his father; Lung cancer in his maternal uncle; Stroke in his brother.   Current Medications:        Current Outpatient Medications (Other):    hydrocortisone (ANUSOL-HC) 2.5 % rectal cream, Place 1 Application rectally 2 (two) times daily.   Casanthranol-Docusate Sodium (STOOL SOFTENER PLUS PO), Take 1 tablet by mouth as needed (constipation). (Patient not taking: Reported on 06/25/2022)   dicyclomine (BENTYL) 20 MG tablet, Take 1 tablet (20 mg total) by mouth 2 (two) times daily as needed for spasms. (Patient not taking: Reported on 06/25/2022)  Medical History:  Past Medical History:  Diagnosis Date   HTN (hypertension)    Kidney stones    Sleep apnea with use of continuous positive airway pressure (CPAP)    Allergies:  Allergies  Allergen Reactions   Pollen Extract Itching    Other reaction(s): Cough  Surgical History:  He  has a past surgical history that includes Inguinal hernia repair (Left). Family History:  His family history includes Colon cancer in his maternal grandfather; Diabetes in his father and mother; Heart attack in his brother; High Cholesterol in his father and mother; Hypertension in his father and mother; Kidney disease in his father; Lung cancer in his maternal uncle; Stroke in his  brother. Social History:   reports that he has never smoked. He has never used smokeless tobacco. He reports current alcohol use. He reports that he does not use drugs.  REVIEW OF SYSTEMS  : All other systems reviewed and negative except where noted in the History of Present Illness.   PHYSICAL EXAM: BP (!) 116/92 (BP Location: Left Arm, Patient Position: Sitting, Cuff Size: Large)   Pulse (!) 132   Ht 5\' 9"  (1.753 m)   Wt 177 lb 6 oz (80.5 kg)   BMI 26.19 kg/m  General:   Pleasant, well developed male in no acute distress Head:   Normocephalic and atraumatic. Eyes:  sclerae anicteric,conjunctive pink  Heart:   regular rate and rhythm Pulm:  Clear anteriorly; no wheezing Abdomen:   Soft, Flat AB, Active bowel sounds. No tenderness in the lower abdomen. Without guarding and Without rebound, No organomegaly appreciated. Rectal: declines Extremities:  Without edema. Msk: Symmetrical without gross deformities. Peripheral pulses intact.  Neurologic:  Alert and  oriented x4;  No focal deficits.  Skin:   Dry and intact without significant lesions or rashes. Psychiatric:  Cooperative. Normal mood and affect.    , PA-C 3:27 PM

## 2022-06-25 ENCOUNTER — Ambulatory Visit (INDEPENDENT_AMBULATORY_CARE_PROVIDER_SITE_OTHER): Payer: Commercial Managed Care - PPO | Admitting: Physician Assistant

## 2022-06-25 ENCOUNTER — Other Ambulatory Visit (INDEPENDENT_AMBULATORY_CARE_PROVIDER_SITE_OTHER): Payer: Commercial Managed Care - PPO

## 2022-06-25 ENCOUNTER — Encounter: Payer: Self-pay | Admitting: Physician Assistant

## 2022-06-25 DIAGNOSIS — R198 Other specified symptoms and signs involving the digestive system and abdomen: Secondary | ICD-10-CM

## 2022-06-25 DIAGNOSIS — R1084 Generalized abdominal pain: Secondary | ICD-10-CM

## 2022-06-25 DIAGNOSIS — K625 Hemorrhage of anus and rectum: Secondary | ICD-10-CM | POA: Diagnosis not present

## 2022-06-25 DIAGNOSIS — G4733 Obstructive sleep apnea (adult) (pediatric): Secondary | ICD-10-CM

## 2022-06-25 LAB — CBC WITH DIFFERENTIAL/PLATELET
Basophils Absolute: 0 10*3/uL (ref 0.0–0.1)
Basophils Relative: 0.4 % (ref 0.0–3.0)
Eosinophils Absolute: 0 10*3/uL (ref 0.0–0.7)
Eosinophils Relative: 0.8 % (ref 0.0–5.0)
HCT: 45.3 % (ref 39.0–52.0)
Hemoglobin: 15.1 g/dL (ref 13.0–17.0)
Lymphocytes Relative: 35 % (ref 12.0–46.0)
Lymphs Abs: 1.6 10*3/uL (ref 0.7–4.0)
MCHC: 33.2 g/dL (ref 30.0–36.0)
MCV: 91.9 fl (ref 78.0–100.0)
Monocytes Absolute: 0.4 10*3/uL (ref 0.1–1.0)
Monocytes Relative: 8.4 % (ref 3.0–12.0)
Neutro Abs: 2.6 10*3/uL (ref 1.4–7.7)
Neutrophils Relative %: 55.4 % (ref 43.0–77.0)
Platelets: 229 10*3/uL (ref 150.0–400.0)
RBC: 4.93 Mil/uL (ref 4.22–5.81)
RDW: 13.4 % (ref 11.5–15.5)
WBC: 4.7 10*3/uL (ref 4.0–10.5)

## 2022-06-25 LAB — COMPREHENSIVE METABOLIC PANEL
ALT: 16 U/L (ref 0–53)
AST: 14 U/L (ref 0–37)
Albumin: 4.5 g/dL (ref 3.5–5.2)
Alkaline Phosphatase: 77 U/L (ref 39–117)
BUN: 11 mg/dL (ref 6–23)
CO2: 29 mEq/L (ref 19–32)
Calcium: 9.8 mg/dL (ref 8.4–10.5)
Chloride: 104 mEq/L (ref 96–112)
Creatinine, Ser: 1.11 mg/dL (ref 0.40–1.50)
GFR: 86.29 mL/min (ref >60.00)
Glucose, Bld: 102 mg/dL — ABNORMAL HIGH (ref 70–99)
Potassium: 4.5 mEq/L (ref 3.5–5.1)
Sodium: 138 mEq/L (ref 135–145)
Total Bilirubin: 0.9 mg/dL (ref 0.2–1.2)
Total Protein: 7.2 g/dL (ref 6.0–8.3)

## 2022-06-25 LAB — SEDIMENTATION RATE: Sed Rate: 4 mm/hr (ref 0–15)

## 2022-06-25 LAB — HIGH SENSITIVITY CRP: CRP, High Sensitivity: 0.37 mg/L (ref 0.000–5.000)

## 2022-06-25 MED ORDER — NA SULFATE-K SULFATE-MG SULF 17.5-3.13-1.6 GM/177ML PO SOLN: 1.0000 | ORAL | 0 refills | Status: DC

## 2022-06-25 MED ORDER — HYDROCORTISONE (PERIANAL) 2.5 % EX CREA: 1.0000 | TOPICAL_CREAM | Freq: Two times a day (BID) | CUTANEOUS | 2 refills | Status: DC

## 2022-06-25 NOTE — Patient Instructions (Addendum)
If you are age 35 or younger, your body mass index should be between 19-25. Your Body mass index is 26.19 kg/m. If this is out of the aformentioned range listed, please consider follow up with your Primary Care Provider.  ________________________________________________________  The [facility] providers would like to encourage you to use [facility] to communicate with providers for non-urgent requests or questions.  Due to long hold times on the telephone, sending your provider a message by [facility] may be a faster and more efficient way to get a response.  Please allow 48 business hours for a response.  Please remember that this is for non-urgent requests.  _______________________________________________________ Your provider has requested that you go to the basement level for lab work before leaving today. Press "B" on the elevator. The lab is located at the first door on the left as you exit the elevator.  You have been scheduled for a colonoscopy. Please follow written instructions given to you at your visit today.  Please pick up your prep supplies at the pharmacy within the next 1-3 days. If you use inhalers (even only as needed), please bring them with you on the day of your procedure.  Due to recent changes in healthcare laws, you may see the results of your imaging and laboratory studies on MyChart before your provider has had a chance to review them.  We understand that in some cases there may be results that are confusing or concerning to you. Not all laboratory results come back in the same time frame and the provider may be waiting for multiple results in order to interpret others.  Please give [place] 48 hours in order for your provider to thoroughly review all the results before contacting the office for clarification of your results.    Add fiber like benefiber or citracel once a day Can do trial of IBGard for AB pain- Take 1-2 capsules once a day for maintence or twice a day during a  flare Will also send in an anti spasm medication to take as needed   Please try low FODMAP diet- see below- start with just one column at a time.    FODMAP stands for fermentable oligo-, di-, mono-saccharides and polyols (1). These are the scientific terms used to classify groups of carbs that are notorious for triggering digestive symptoms like bloating, gas and stomach pain.      Please do sitz baths- these can be found at the pharmacy. It is a Chief Operating Officer that is put in your toliet.  Please increase fiber or add benefiber, increase water and increase acitivity.  Apply a pea size amount of over the counter Anusol HC cream to the tip of an over the counter PrepH suppository and insert rectally once every night for at least 7 nights.  If this does not improve there are procedures that can be done.   Toileting tips to help with your constipation - Drink at least 64-80 ounces of water/liquid per day. - Establish a time to try to move your bowels every day.  For many people, this is after a cup of coffee or after a meal such as breakfast. - Sit all of the way back on the toilet keeping your back fairly straight and while sitting up, try to rest the tops of your forearms on your upper thighs.   - Raising your feet with a step stool/squatty potty can be helpful to improve the angle that allows your stool to pass through the rectum. - Relax  the rectum feeling it bulge toward the toilet water.  If you feel your rectum raising toward your body, you are contracting rather than relaxing. - Breathe in and slowly exhale. "Belly breath" by expanding your belly towards your belly button. Keep belly expanded as you gently direct pressure down and back to the anus.  A low pitched GRRR sound can assist with increasing intra-abdominal pressure.  - Repeat 3-4 times. If unsuccessful, contract the pelvic floor to restore normal tone and get off the toilet.  Avoid excessive straining. - To reduce excessive  wiping by teaching your anus to normally contract, place hands on outer aspect of knees and resist knee movement outward.  Hold 5-10 second then place hands just inside of knees and resist inward movement of knees.  Hold 5 seconds.  Repeat a few times each way.  Thank you for entrusting me with your care and choosing [facility].  Quentin Mulling, PA-C

## 2022-06-25 NOTE — Progress Notes (Signed)
Agree with assessment and plan as outlined.  Marchelle Folks I think this patient should follow-up with [place] in the next 4 to 6 weeks.  If his bleeding persists despite conservative measures he will need a colonoscopy, I noted that DRE was declined today.

## 2022-06-26 LAB — H. PYLORI ANTIBODY, IGG: H Pylori IgG: NEGATIVE

## 2022-07-02 ENCOUNTER — Encounter: Payer: Self-pay | Admitting: Gastroenterology

## 2022-07-05 ENCOUNTER — Telehealth: Payer: Self-pay | Admitting: Gastroenterology

## 2022-07-05 NOTE — Telephone Encounter (Signed)
Okay sorry to hear this, I appreciate you letting me know

## 2022-07-05 NOTE — Telephone Encounter (Signed)
Patient called requesting to reschedule his procedure from 07/09/22 due to scheduling conflict he is now in for 07/26/22. Is aware of cancellation policy.

## 2022-07-09 ENCOUNTER — Encounter: Payer: Commercial Managed Care - PPO | Admitting: Gastroenterology

## 2022-07-12 ENCOUNTER — Emergency Department (HOSPITAL_COMMUNITY)
Admission: EM | Admit: 2022-07-12 | Discharge: 2022-07-12 | Discharge status: Against Medical Advice | Payer: Commercial Managed Care - PPO | Attending: Emergency Medicine | Admitting: Emergency Medicine

## 2022-07-12 ENCOUNTER — Other Ambulatory Visit: Payer: Self-pay

## 2022-07-12 ENCOUNTER — Encounter (HOSPITAL_COMMUNITY): Payer: Self-pay

## 2022-07-12 DIAGNOSIS — R1084 Generalized abdominal pain: Secondary | ICD-10-CM | POA: Diagnosis present

## 2022-07-12 DIAGNOSIS — R1110 Vomiting, unspecified: Secondary | ICD-10-CM | POA: Insufficient documentation

## 2022-07-12 DIAGNOSIS — Z5321 Procedure and treatment not carried out due to patient leaving prior to being seen by health care provider: Secondary | ICD-10-CM | POA: Insufficient documentation

## 2022-07-12 LAB — COMPREHENSIVE METABOLIC PANEL
ALT: 22 U/L (ref 0–44)
AST: 19 U/L (ref 15–41)
Albumin: 4.1 g/dL (ref 3.5–5.0)
Alkaline Phosphatase: 94 U/L (ref 38–126)
Anion gap: 8 (ref 5–15)
BUN: 12 mg/dL (ref 6–20)
CO2: 24 mmol/L (ref 22–32)
Calcium: 9.4 mg/dL (ref 8.9–10.3)
Chloride: 108 mmol/L (ref 98–111)
Creatinine, Ser: 1.07 mg/dL (ref 0.61–1.24)
GFR, Estimated: >60 mL/min (ref >60)
Glucose, Bld: 122 mg/dL — ABNORMAL HIGH (ref 70–99)
Potassium: 3 mmol/L — ABNORMAL LOW (ref 3.5–5.1)
Sodium: 140 mmol/L (ref 135–145)
Total Bilirubin: 0.9 mg/dL (ref 0.3–1.2)
Total Protein: 6.8 g/dL (ref 6.5–8.1)

## 2022-07-12 LAB — CBC WITH DIFFERENTIAL/PLATELET
Abs Immature Granulocytes: 0.01 10*3/uL (ref 0.00–0.07)
Basophils Absolute: 0 10*3/uL (ref 0.0–0.1)
Basophils Relative: 1 %
Eosinophils Absolute: 0.1 10*3/uL (ref 0.0–0.5)
Eosinophils Relative: 1 %
HCT: 41.9 % (ref 39.0–52.0)
Hemoglobin: 14.4 g/dL (ref 13.0–17.0)
Immature Granulocytes: 0 %
Lymphocytes Relative: 55 %
Lymphs Abs: 4.1 10*3/uL — ABNORMAL HIGH (ref 0.7–4.0)
MCH: 31.4 pg (ref 26.0–34.0)
MCHC: 34.4 g/dL (ref 30.0–36.0)
MCV: 91.5 fL (ref 80.0–100.0)
Monocytes Absolute: 0.7 10*3/uL (ref 0.1–1.0)
Monocytes Relative: 9 %
Neutro Abs: 2.4 10*3/uL (ref 1.7–7.7)
Neutrophils Relative %: 34 %
Platelets: 237 10*3/uL (ref 150–400)
RBC: 4.58 MIL/uL (ref 4.22–5.81)
RDW: 11.9 % (ref 11.5–15.5)
WBC: 7.3 10*3/uL (ref 4.0–10.5)
nRBC: 0 % (ref 0.0–0.2)

## 2022-07-12 LAB — URINALYSIS, ROUTINE W REFLEX MICROSCOPIC
Bilirubin Urine: NEGATIVE
Glucose, UA: NEGATIVE mg/dL
Ketones, ur: NEGATIVE mg/dL
Leukocytes,Ua: NEGATIVE
Nitrite: NEGATIVE
Protein, ur: 30 mg/dL — AB
RBC / HPF: >50 RBC/hpf — ABNORMAL HIGH (ref 0–5)
Specific Gravity, Urine: 1.023 (ref 1.005–1.030)
pH: 5 (ref 5.0–8.0)

## 2022-07-12 LAB — LIPASE, BLOOD: Lipase: 32 U/L (ref 11–51)

## 2022-07-12 MED ORDER — ONDANSETRON 4 MG PO TBDP
4.0000 mg | ORAL_TABLET | Freq: Once | ORAL | Status: AC
  Administered 2022-07-12: 4 mg via ORAL
  Filled 2022-07-12: qty 1

## 2022-07-12 NOTE — ED Triage Notes (Signed)
Generalized abdominal pain that began ~ 1 hr pta.

## 2022-07-12 NOTE — ED Notes (Signed)
Multiple episodes of vomiting in waiting area. Brought back for reassessment and medication administration.

## 2022-07-12 NOTE — ED Notes (Signed)
Pt ambulatory to restroom w/out assistance. Pt provided with specimen cup to provide urine sample.

## 2022-07-26 ENCOUNTER — Encounter: Payer: Self-pay | Admitting: Gastroenterology

## 2022-07-26 ENCOUNTER — Ambulatory Visit (AMBULATORY_SURGERY_CENTER): Payer: Commercial Managed Care - PPO | Admitting: Gastroenterology

## 2022-07-26 VITALS — BP 113/76 | HR 100 | Temp 98.4°F | Resp 11 | Ht 69.0 in | Wt 177.0 lb

## 2022-07-26 DIAGNOSIS — K649 Unspecified hemorrhoids: Secondary | ICD-10-CM | POA: Diagnosis not present

## 2022-07-26 DIAGNOSIS — R194 Change in bowel habit: Secondary | ICD-10-CM | POA: Diagnosis not present

## 2022-07-26 DIAGNOSIS — K6289 Other specified diseases of anus and rectum: Secondary | ICD-10-CM

## 2022-07-26 DIAGNOSIS — K625 Hemorrhage of anus and rectum: Secondary | ICD-10-CM

## 2022-07-26 DIAGNOSIS — K6389 Other specified diseases of intestine: Secondary | ICD-10-CM | POA: Diagnosis not present

## 2022-07-26 MED ORDER — SODIUM CHLORIDE 0.9 % IV SOLN: 500.0000 mL | Freq: Once | INTRAVENOUS | Status: DC

## 2022-07-26 MED ORDER — MESALAMINE 1000 MG RE SUPP: 1000.0000 mg | Freq: Every day | RECTAL | 3 refills | Status: DC

## 2022-07-26 NOTE — Progress Notes (Signed)
Called to room to assist during endoscopic procedure.  Patient ID and intended procedure confirmed with present staff. Received instructions for my participation in the procedure from the performing physician.  

## 2022-07-26 NOTE — Patient Instructions (Signed)
Start Canasa, one suppository nightly.  A RX was sent to your pharmacy.  Avoid NSAIDS.  Await pathology results.  YOU HAD AN ENDOSCOPIC PROCEDURE TODAY AT THE [facility]:   Refer to the procedure report that was given to you for any specific questions about what was found during the examination.  If the procedure report does not answer your questions, please call your gastroenterologist to clarify.  If you requested that your care partner not be given the details of your procedure findings, then the procedure report has been included in a sealed envelope for you to review at your convenience later.  YOU SHOULD EXPECT: Some feelings of bloating in the abdomen. Passage of more gas than usual.  Walking can help get rid of the air that was put into your GI tract during the procedure and reduce the bloating. If you had a lower endoscopy (such as a colonoscopy or flexible sigmoidoscopy) you may notice spotting of blood in your stool or on the toilet paper. If you underwent a bowel prep for your procedure, you may not have a normal bowel movement for a few days.  Please Note:  You might notice some irritation and congestion in your nose or some drainage.  This is from the oxygen used during your procedure.  There is no need for concern and it should clear up in a day or so.  SYMPTOMS TO REPORT IMMEDIATELY:  Following lower endoscopy (colonoscopy or flexible sigmoidoscopy):  Excessive amounts of blood in the stool  Significant tenderness or worsening of abdominal pains  Swelling of the abdomen that is new, acute  Fever of 100F or higher  For urgent or emergent issues, a gastroenterologist can be reached at any hour by calling [phone]. Do not use MyChart messaging for urgent concerns.    DIET:  We do recommend a small meal at first, but then you may proceed to your regular diet.  Drink plenty of fluids but you should avoid alcoholic beverages for 24 hours.  ACTIVITY:  You  should plan to take it easy for the rest of today and you should NOT DRIVE or use heavy machinery until tomorrow (because of the sedation medicines used during the test).    FOLLOW UP: Our staff will call the number listed on your records the next business day following your procedure.  We will call around 7:15- 8:00 am to check on you and address any questions or concerns that you may have regarding the information given to you following your procedure. If we do not reach you, we will leave a message.     If any biopsies were taken you will be contacted by phone or by letter within the next 1-3 weeks.  Please call us at [phone] if you have not heard about the biopsies in 3 weeks.    SIGNATURES/CONFIDENTIALITY: You and/or your care partner have signed paperwork which will be entered into your electronic medical record.  These signatures attest to the fact that that the information above on your After Visit Summary has been reviewed and is understood.  Full responsibility of the confidentiality of this discharge information lies with you and/or your care-partner.

## 2022-07-26 NOTE — Progress Notes (Signed)
To pacu, VSS. Report to [email or web address] 

## 2022-07-26 NOTE — Progress Notes (Signed)
VS completed by DT.  Pt's states no medical or surgical changes since previsit or office visit.  

## 2022-07-26 NOTE — Progress Notes (Signed)
[facility] History and Physical   Primary Care Physician:  Patient, No Pcp Per   Reason for Procedure:   Rectal bleeding, altered bowel habits  Plan:    colonoscopy     HPI: Garrett George is a 36 y.o. male  here for colonoscopy to evaluate symptoms of bleeding and irregular bowel habits - both loose stools and constipation, 50/50. Labs and recent CT scan looked okay. Otherwise feels well without any cardiopulmonary symptoms.   I have discussed risks / benefits of anesthesia and endoscopic procedure with Oneita Jolly and they wish to proceed with the exams as outlined today.    Past Medical History:  Diagnosis Date   HTN (hypertension)    Kidney stones    Sleep apnea with use of continuous positive airway pressure (CPAP)     Past Surgical History:  Procedure Laterality Date   INGUINAL HERNIA REPAIR Left     Prior to Admission medications   Medication Sig Start Date End Date Taking? Authorizing Provider  Casanthranol-Docusate Sodium (STOOL SOFTENER PLUS PO) Take 1 tablet by mouth as needed (constipation). Patient not taking: Reported on 06/25/2022    [provider]  dicyclomine (BENTYL) 20 MG tablet Take 1 tablet (20 mg total) by mouth 2 (two) times daily as needed for spasms. Patient not taking: Reported on 06/25/2022 05/26/22   Sponseller, Eugene Gavia, PA-C  hydrocortisone (ANUSOL-HC) 2.5 % rectal cream Place 1 Application rectally 2 (two) times daily. 06/25/22   Doree Albee, PA-C    Current Outpatient Medications  Medication Sig Dispense Refill   Casanthranol-Docusate Sodium (STOOL SOFTENER PLUS PO) Take 1 tablet by mouth as needed (constipation). (Patient not taking: Reported on 06/25/2022)     dicyclomine (BENTYL) 20 MG tablet Take 1 tablet (20 mg total) by mouth 2 (two) times daily as needed for spasms. (Patient not taking: Reported on 06/25/2022) 20 tablet 0   hydrocortisone (ANUSOL-HC) 2.5 % rectal cream Place 1 Application rectally 2 (two)  times daily. 30 g 2   Current Facility-Administered Medications  Medication Dose Route Frequency Provider Last Rate Last Admin   0.9 %  sodium chloride infusion  500 mL Intravenous Once Tonyetta Berko, Willaim Rayas, MD        Allergies as of 07/26/2022 - Review Complete 07/26/2022  Allergen Reaction Noted   Pollen extract Itching 12/11/2021    Family History  Problem Relation Age of Onset   Diabetes Mother    Hypertension Mother    High Cholesterol Mother    Diabetes Father    Kidney disease Father    Hypertension Father    High Cholesterol Father    Heart attack Brother    Stroke Brother    Colon cancer Maternal Grandfather    Lung cancer Maternal Uncle     Social History   Socioeconomic History   Marital status: Single    Spouse name: Not on file   Number of children: 0   Years of education: Not on file   Highest education level: Not on file  Occupational History   Occupation: self employed  Tobacco Use   Smoking status: Never   Smokeless tobacco: Never  Vaping Use   Vaping Use: Never used  Substance and Sexual Activity   Alcohol use: Yes    Comment: social wine   Drug use: No   Sexual activity: Never  Other Topics Concern   Not on file  Social History Narrative   Not on file   Social Determinants of  Health   Financial Resource Strain: Not on file  Food Insecurity: Not on file  Transportation Needs: Not on file  Physical Activity: Not on file  Stress: Not on file  Social Connections: Not on file  Intimate Partner Violence: Not on file    Review of Systems: All other review of systems negative except as mentioned in the HPI.  Physical Exam: Vital signs BP 118/80   Pulse (!) 131   Temp 98.4 F (36.9 C) (Temporal)   Ht 5\' 9"  (1.753 m)   Wt 177 lb (80.3 kg)   SpO2 100%   BMI 26.14 kg/m   General:   Alert,  Well-developed, pleasant and cooperative in NAD Lungs:  Clear throughout to auscultation.   Heart:  Regular rate and rhythm Abdomen:  Soft,  nontender and nondistended.   Neuro/Psych:  Alert and cooperative. Normal mood and affect. A and O x 3  , MD [facility]

## 2022-07-26 NOTE — Op Note (Signed)
[facility] Patient Name: Garrett George Procedure Date: 07/26/2022 1:56 PM MRN: [id number] Endoscopist: Viviann Spare P. Adela Lank , MD Age: 36 Referring MD:  Date of Birth: February 05, 1986 Gender: Male Account #: [id number] Procedure:                Colonoscopy Indications:              Rectal bleeding, Change in bowel habits Medicines:                Monitored Anesthesia Care Procedure:                Pre-Anesthesia Assessment:                           - Prior to the procedure, a History and Physical                            was performed, and patient medications and                            allergies were reviewed. The patient's tolerance of                            previous anesthesia was also reviewed. The risks                            and benefits of the procedure and the sedation                            options and risks were discussed with the patient.                            All questions were answered, and informed consent                            was obtained. Prior Anticoagulants: The patient has                            taken no previous anticoagulant or antiplatelet                            agents. ASA Grade Assessment: II - A patient with                            mild systemic disease. After reviewing the risks                            and benefits, the patient was deemed in                            satisfactory condition to undergo the procedure.                           After obtaining informed consent, the colonoscope  was passed under direct vision. Throughout the                            procedure, the patient's blood pressure, pulse, and                            oxygen saturations were monitored continuously. The                            Colonoscope was introduced through the anus and                            advanced to the the terminal ileum, with                            identification of the  appendiceal orifice and IC                            valve. The colonoscopy was performed without                            difficulty. The patient tolerated the procedure                            well. The quality of the bowel preparation was                            good. The terminal ileum, ileocecal valve,                            appendiceal orifice, and rectum were photographed. Scope In: 2:04:51 PM Scope Out: 2:18:14 PM Scope Withdrawal Time: 0 hours 11 minutes 9 seconds  Total Procedure Duration: 0 hours 13 minutes 23 seconds  Findings:                 The perianal and digital rectal examinations were                            normal.                           The terminal ileum appeared normal.                           Diffuse mild inflammation characterized by                            erythema, granularity and loss of vascularity was                            found in the distal rectum (perhaps 5cm or so up                            from the dentate line) and at the appendiceal  orifice (cecal patch). Biopsies were taken with a                            cold forceps for histology.                           Internal hemorrhoids were found during                            retroflexion. The hemorrhoids were small.                           The exam was otherwise without abnormality. Complications:            No immediate complications. Estimated blood loss:                            Minimal. Estimated Blood Loss:     Estimated blood loss was minimal. Impression:               - The examined portion of the ileum was normal.                           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                             Biopsied.                           - Internal hemorrhoids.                           - The examination was otherwise normal. Recommendation:           - Patient has a contact number available for                            emergencies. The signs and symptoms of potential                            delayed complications were discussed with the                            patient. Return to normal activities tomorrow.                            Written discharge instructions were provided to the  patient.                           - Resume previous diet.                           - Continue present medications.                           - Start Canasa one suppository nightly                           - Avoid NSAIDs                           - Await pathology results with further                            recommendations Viviann Spare P. Jaivon Vanbeek, MD 07/26/2022 2:36:39 PM This report has been signed electronically.

## 2022-07-27 ENCOUNTER — Telehealth: Payer: Self-pay | Admitting: Gastroenterology

## 2022-07-27 ENCOUNTER — Telehealth: Payer: Self-pay

## 2022-07-27 DIAGNOSIS — K625 Hemorrhage of anus and rectum: Secondary | ICD-10-CM

## 2022-07-27 DIAGNOSIS — R194 Change in bowel habit: Secondary | ICD-10-CM

## 2022-07-27 MED ORDER — MESALAMINE 1000 MG RE SUPP: 1000.0000 mg | Freq: Every day | RECTAL | 3 refills | Status: DC

## 2022-07-27 NOTE — Telephone Encounter (Signed)
Spoke with Dr. Adela Lank- ok to use generic.  Canasa suppository RX resent to pharmacy, note made ok to use generic.  Pt is aware

## 2022-07-27 NOTE — Telephone Encounter (Signed)
  Follow up Call-     07/26/2022    1:33 PM  Call back number  Post procedure Call Back phone  # [phone]  Permission to leave phone message Yes     Patient questions:  Do you have a fever, pain , or abdominal swelling? No. Pain Score  0 *  Have you tolerated food without any problems? Yes.    Have you been able to return to your normal activities? Yes.    Do you have any questions about your discharge instructions: Diet   No. Medications  No. Follow up visit  No.  Do you have questions or concerns about your Care? No.  Actions: * If pain score is 4 or above: No action needed, pain <4.

## 2022-07-27 NOTE — Telephone Encounter (Signed)
Thank you :)

## 2022-07-27 NOTE — Telephone Encounter (Signed)
Patient had procedure 9-14. Patient called about Canasa Medication prescribed. Patient is inquiring if there is a generic brand due to the prescription being $800. Please give a call to further advise.  Thank you

## 2022-08-01 ENCOUNTER — Other Ambulatory Visit: Payer: Self-pay

## 2022-08-01 DIAGNOSIS — R194 Change in bowel habit: Secondary | ICD-10-CM

## 2022-08-01 DIAGNOSIS — K625 Hemorrhage of anus and rectum: Secondary | ICD-10-CM

## 2022-08-01 MED ORDER — MESALAMINE 1000 MG RE SUPP: 1000.0000 mg | Freq: Every day | RECTAL | 3 refills | Status: DC

## 2022-10-11 ENCOUNTER — Ambulatory Visit: Payer: Commercial Managed Care - PPO | Admitting: Gastroenterology

## 2022-10-11 NOTE — Progress Notes (Deleted)
HPI :  36 y.o. male with OSA not on CPAP presents for evaluation of abdominal pain, rectal bleeding.    05/26/2022 CT abdomen pelvis with contrast for left lower quadrant pain unremarkable Labs reviewed from 05/26/2022 show no anemia hemoglobin 15.5, no leukocytosis, normal kidney, Normal liver Has some tachycardia today, states he is anxious.   He has been having AB pain for 2-3 days, got worse so went to the ER.  Had some nausea, no vomiting. No fever, chills.  He goes back and for with diarrhea/constipation x several weeks, he has had intermittent rectal bleeding with this, BRB streaks of blood in toliet, with stool, on TP, has seen some clots/clumps of darker blood.  Was having BM daily but has only had every other day in the last week.  Can have loose stools or hard formed stools.  No rectal pain, burning, itching.  Down about 5 lbs in a month.  Rare ETOH, no smoking, no drugs.  [place] GF with colon cancer.  Was similar pain to kidney stone in 2022 without contrast, but negative CT scan WITH contrast.    36 year old male with history of alternating constipation and diarrhea with 2 months of rectal bleeding.   No anemia.  CT abdomen pelvis without evidence of colitis. Declined rectal exam. We will plan for lab evaluation, will send in Anusol cream to apply to Preparation H. - Increase fiber/ water intake, decrease caffeine, increase activity level. -Will add on Miralax daily and Benefiber Due to length of bleeding and AB pain, discussed with patient and will proceed with colonoscopy  We have discussed the risks of bleeding, infection, perforation, medication reactions, and remote risk of death associated with colonoscopy. All questions were answered and the patient acknowledges these risk and wishes to proceed.    Colonoscopy 07/26/2022: The perianal and digital rectal examinations were normal. - The terminal ileum appeared normal. - Diffuse mild inflammation characterized  by erythema, granularity and loss of vascularity was found in the distal rectum (perhaps 5cm or so up from the dentate line) and at the appendiceal orifice (cecal patch). Biopsies were taken with a cold forceps for histology. - Internal hemorrhoids were found during retroflexion. The hemorrhoids were small. - The exam was otherwise without abnormality.  Diagnosis 1. Surgical [P], colon, cecum - COLONIC MUCOSA WITH PROMINENT LAMINA PROPRIA LYMPHOPLASMACYTOSIS AND ASSOCIATED ARCHITECTURAL DISARRAY WITH FOCAL MILD ACTIVITY. - NEGATIVE FOR GRANULOMAS, MORPHOLOGIC EVIDENCE OF VIRAL CYTOPATHIC EFFECT AND DYSPLASIA. 2. Surgical [P], colon, rectum - COLONIC MUCOSA WITH PROMINENT BASAL LYMPHOPLASMACYTOSIS WITH ASSOCIATED ARCHITECTURAL DISARRAY AND FOCAL MINIMAL ACTIVITY. - NEGATIVE FOR GRANULOMAS, MORPHOLOGIC EVIDENCE OF VIRAL CYTOPATHIC EFFECT AND DYSPLASIA. NOTE: THE ABOVE FINDINGS SHOW EVIDENCE OF CHRONICITY WITH MINIMAL TO MILD ACTIVITY SUGGESTIVE OF A CHRONIC ACTIVE COLITIS SUGGESTIVE OF INFLAMMATORY BOWEL DISEASE IN THE APPROPRIATE CLINICAL/ENDOSCOPIC SETTING. GIVEN THE LOCATIONS THIS COULD REPRESENT EITHER CROHN'S DISEASE OR ULCERATIVE COLITIS WITH CECAL PATCH. CLINICAL CORRELATION RECOMMENDED   Started on mesalamine suppositories     Past Medical History:  Diagnosis Date   HTN (hypertension)    Kidney stones    Sleep apnea with use of continuous positive airway pressure (CPAP)      Past Surgical History:  Procedure Laterality Date   INGUINAL HERNIA REPAIR Left    Family History  Problem Relation Age of Onset   Diabetes Mother    Hypertension Mother    High Cholesterol Mother    Diabetes Father    Kidney disease Father    Hypertension Father  High Cholesterol Father    Heart attack Brother    Stroke Brother    Colon cancer Maternal Grandfather    Lung cancer Maternal Uncle    Social History   Tobacco Use   Smoking status: Never   Smokeless tobacco: Never   Vaping Use   Vaping Use: Never used  Substance Use Topics   Alcohol use: Yes    Comment: social wine   Drug use: No   Current Outpatient Medications  Medication Sig Dispense Refill   Casanthranol-Docusate Sodium (STOOL SOFTENER PLUS PO) Take 1 tablet by mouth as needed (constipation). (Patient not taking: Reported on 06/25/2022)     dicyclomine (BENTYL) 20 MG tablet Take 1 tablet (20 mg total) by mouth 2 (two) times daily as needed for spasms. (Patient not taking: Reported on 06/25/2022) 20 tablet 0   hydrocortisone (ANUSOL-HC) 2.5 % rectal cream Place 1 Application rectally 2 (two) times daily. 30 g 2   mesalamine (CANASA) 1000 MG suppository Place 1 suppository (1,000 mg total) rectally at bedtime. use generic -GOOD RX BIN [id number] PCN: [employer]#: [id number] Member ID: [id number] 30 suppository 3   No current facility-administered medications for this visit.   Allergies  Allergen Reactions   Pollen Extract Itching    Other reaction(s): Cough     Review of Systems: All systems reviewed and negative except where noted in HPI.    No results found.  Physical Exam: There were no vitals taken for this visit. Constitutional: Pleasant,well-developed, ***male in no acute distress. HEENT: Normocephalic and atraumatic. Conjunctivae are normal. No scleral icterus. Neck supple.  Cardiovascular: Normal rate, regular rhythm.  Pulmonary/chest: Effort normal and breath sounds normal. No wheezing, rales or rhonchi. Abdominal: Soft, nondistended, nontender. Bowel sounds active throughout. There are no masses palpable. No hepatomegaly. Extremities: no edema Lymphadenopathy: No cervical adenopathy noted. Neurological: Alert and oriented to person place and time. Skin: Skin is warm and dry. No rashes noted. Psychiatric: Normal mood and affect. Behavior is normal.   ASSESSMENT: 36 y.o. male here for assessment of the following  No diagnosis found.  PLAN:   No ref. provider found

## 2023-02-05 IMAGING — CT CT RENAL STONE PROTOCOL
2 of 4 series · 16 of 46 positions shown, 18 images · non-contrast
Comparison: 10/05/2012

CLINICAL DATA: Right lower quadrant pain, nausea,

EXAM:
CT ABDOMEN AND PELVIS WITHOUT CONTRAST
TECHNIQUE: Multidetector CT imaging of the abdomen and pelvis was performed
following the standard protocol without IV contrast.

[Series 3: coronal · coronal · 0.65mm/px · 3 of 140 slices shown]
[im 47/140  soft-tissue]
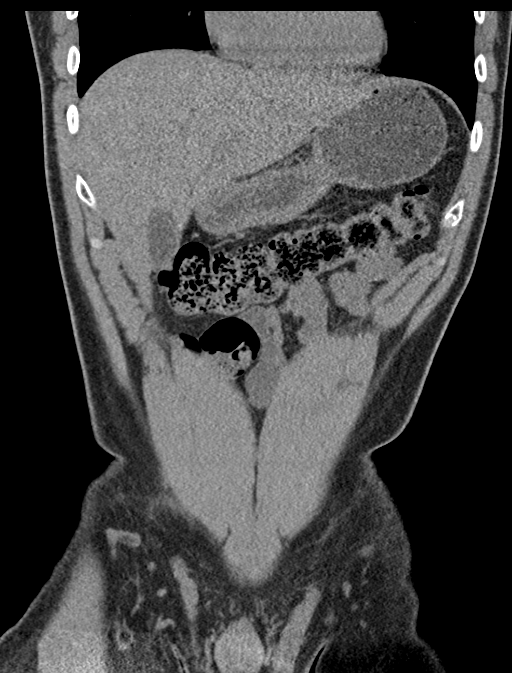
[im 62/140  soft-tissue]
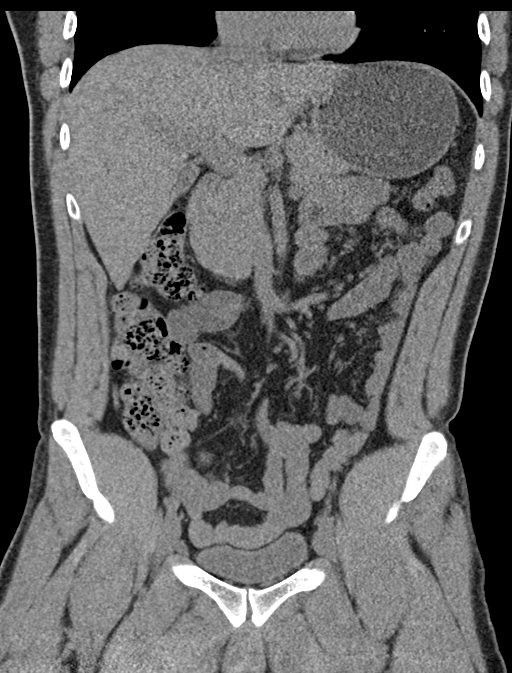
[im 78/140  soft-tissue]
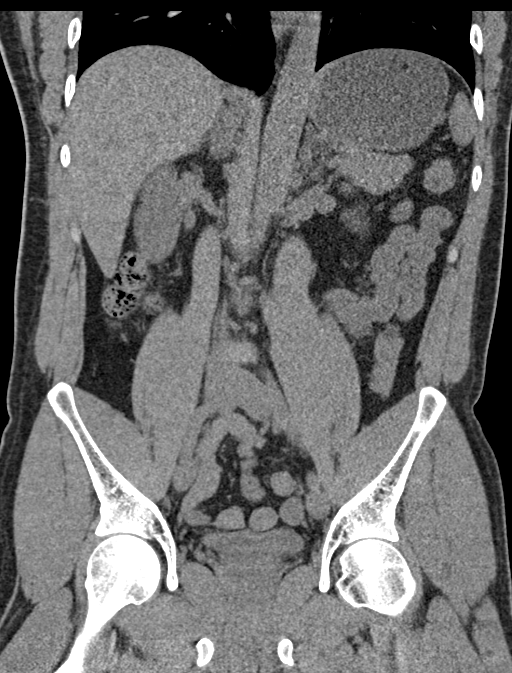

[Series 4: axial st · axial · 0.65mm/px · z∈[-431,-51]mm · 13 of 86 slices shown, 15 images]
[im 5/86  soft-tissue]
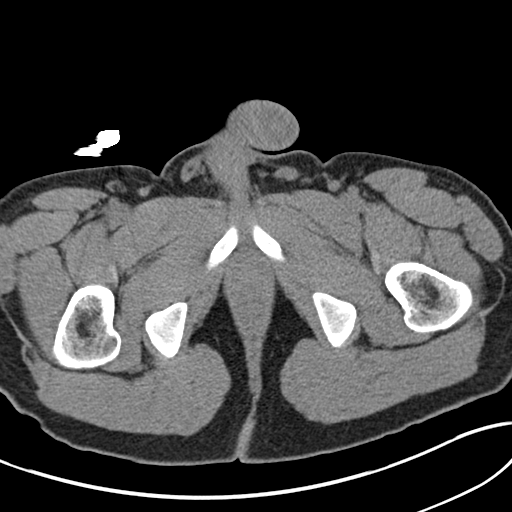
[im 5/86  bone]
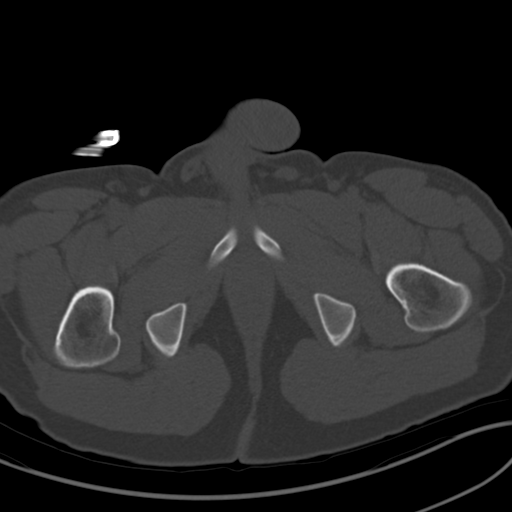
[im 13/86  soft-tissue]
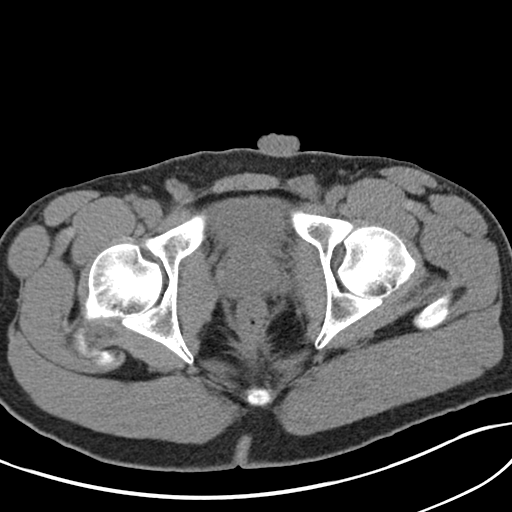
[im 18/86  soft-tissue]
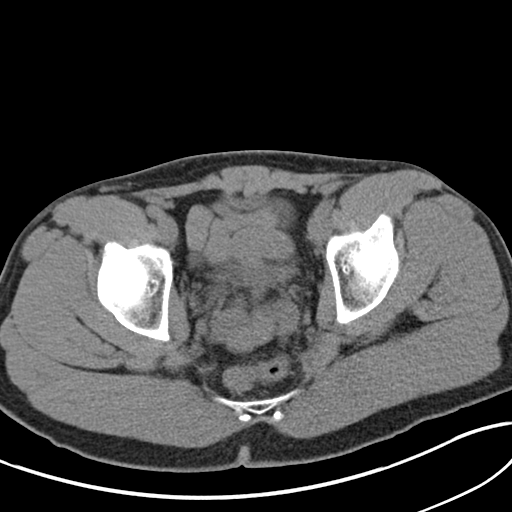
[im 26/86  soft-tissue]
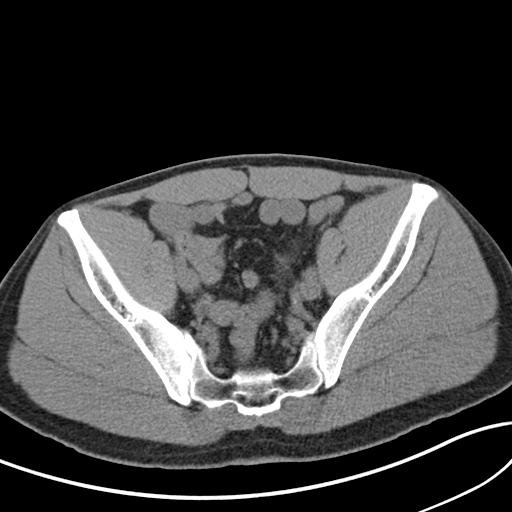
[im 30/86  soft-tissue]
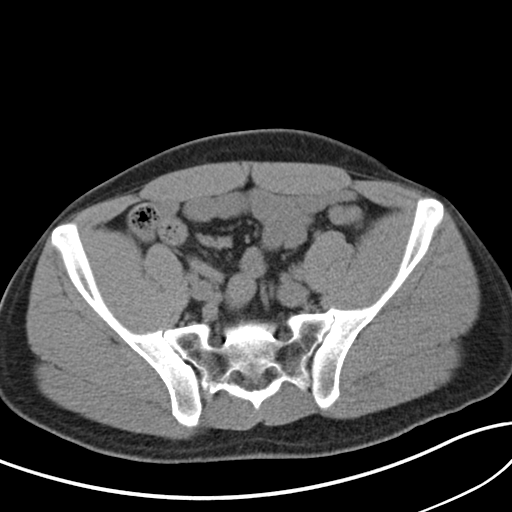
[im 39/86  soft-tissue]
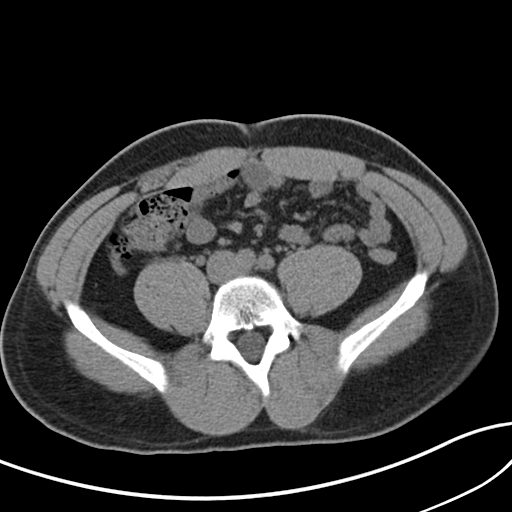
[im 43/86  soft-tissue]
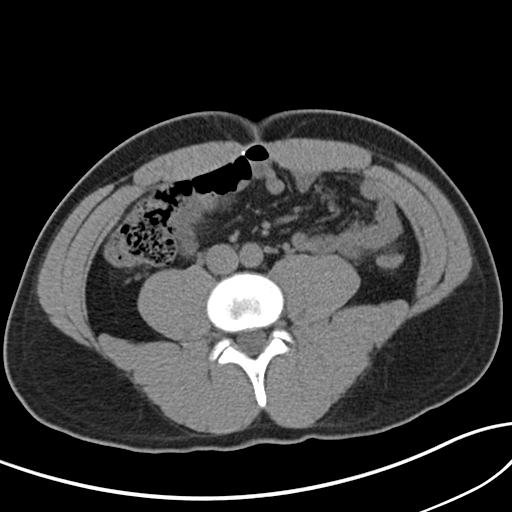
[im 47/86  soft-tissue]
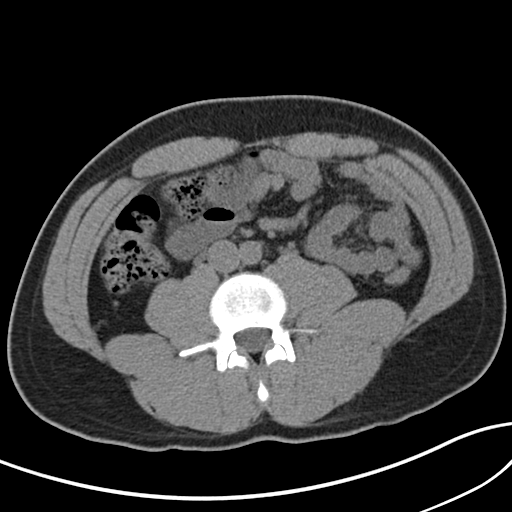
[im 56/86  soft-tissue]
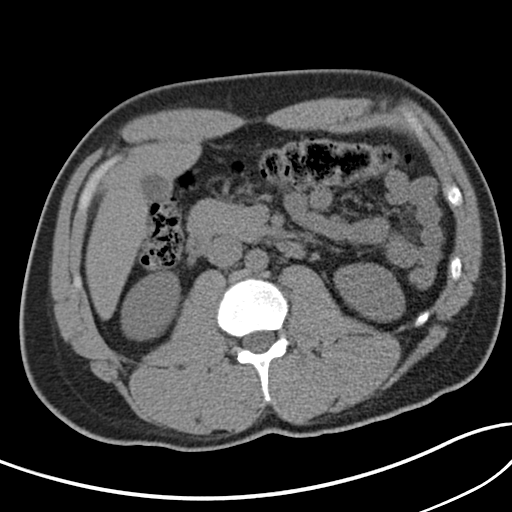
[im 56/86  bone]
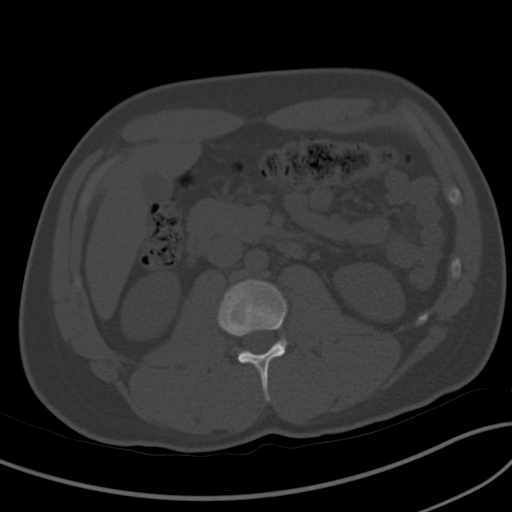
[im 60/86  soft-tissue]
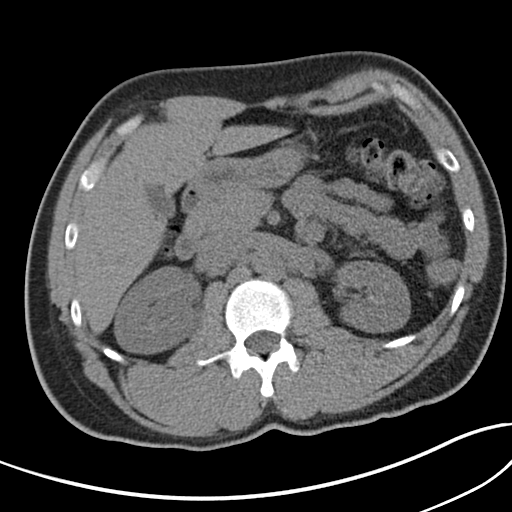
[im 69/86  soft-tissue]
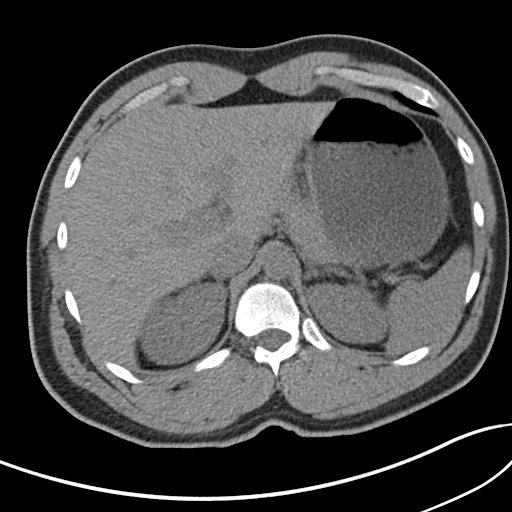
[im 73/86  soft-tissue]
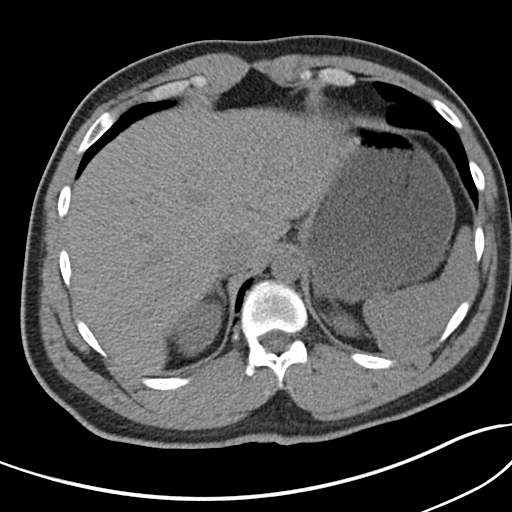
[im 81/86  soft-tissue]
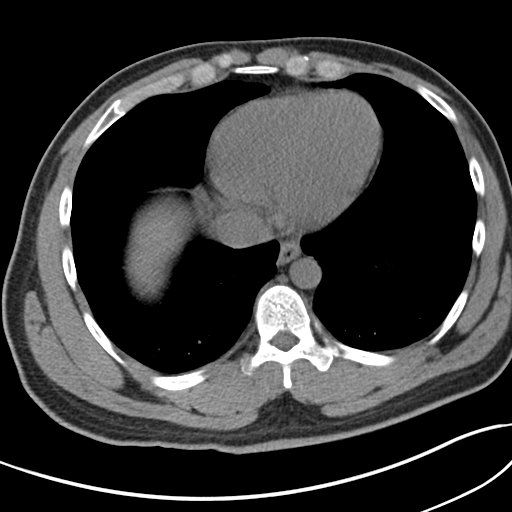

[16 of 46 positions shown; findings below may reference images not displayed]

FINDINGS: Lower chest: Minimal bibasilar dependent atelectasis. The visualized
heart and pericardium are unremarkable.

Hepatobiliary: No focal liver abnormality is seen. No gallstones,
gallbladder wall thickening, or biliary dilatation.

Pancreas: Unremarkable

Spleen: Unremarkable

Adrenals/Urinary Tract: The adrenal glands are unremarkable. The
kidneys are normal in size and position. There is minimal right
hydronephrosis and hydroureter secondary to an obstructing 2 mm
calculus at the right ureterovesicular junction protruding into the
bladder lumen, best appreciated on coronal image # 76. No additional
nephro or urolithiasis. No hydronephrosis on the left. The bladder
is decompressed and is otherwise unremarkable.

Stomach/Bowel: Stomach is within normal limits. Appendix appears
normal. No evidence of bowel wall thickening, distention, or
inflammatory changes. No free intraperitoneal gas or fluid.

Vascular/Lymphatic: No significant vascular findings are present. No
enlarged abdominal or pelvic lymph nodes.

Reproductive: Prostate is unremarkable.

Other: No abdominal wall hernia.  Rectum unremarkable.

Musculoskeletal: No acute or significant osseous findings.
IMPRESSION: Mild left hydronephrosis secondary to an obstructing 2 mm calculus
at the right ureterovesicular junction.

## 2023-03-12 ENCOUNTER — Ambulatory Visit
Admission: EM | Admit: 2023-03-12 | Discharge: 2023-03-12 | Disposition: A | Discharge status: Home / Self Care | Payer: Self-pay | Attending: Internal Medicine | Admitting: Internal Medicine

## 2023-03-12 DIAGNOSIS — R110 Nausea: Secondary | ICD-10-CM

## 2023-03-12 DIAGNOSIS — R197 Diarrhea, unspecified: Secondary | ICD-10-CM

## 2023-03-12 MED ORDER — ONDANSETRON 4 MG PO TBDP: 4.0000 mg | ORAL_TABLET | Freq: Three times a day (TID) | ORAL | 0 refills | Status: DC | PRN

## 2023-03-12 NOTE — ED Provider Notes (Signed)
[facility]    CSN: [id number] Arrival date & time: 03/12/23  1659      History   Chief Complaint Chief Complaint  Patient presents with   Diarrhea    HPI Garrett George is a 37 y.o. male.   Patient presents with diarrhea and nausea without vomiting that started last night.  He reports some cramping intermittent abdominal pain that is generalized as well.  Patient is concerned for food poisoning given that he ate lo mein and pizza yesterday and symptoms started subsequently after.  Reports he had chills yesterday but denies fever or bodyaches.  Denies blood in stool.  Denies any known sick contacts with similar symptoms.  Reports that he has been able to drink water but is not tolerating foods well.   Diarrhea   Past Medical History:  Diagnosis Date   HTN (hypertension)    Kidney stones    Sleep apnea with use of continuous positive airway pressure (CPAP)     There are no problems to display for this patient.   Past Surgical History:  Procedure Laterality Date   INGUINAL HERNIA REPAIR Left        Home Medications    Prior to Admission medications   Medication Sig Start Date End Date Taking? Authorizing Provider  ondansetron (ZOFRAN-ODT) 4 MG disintegrating tablet Take 1 tablet (4 mg total) by mouth every 8 (eight) hours as needed for nausea or vomiting. 03/12/23  Yes Gustavus Bryant, FNP  Casanthranol-Docusate Sodium (STOOL SOFTENER PLUS PO) Take 1 tablet by mouth as needed (constipation). Patient not taking: Reported on 06/25/2022    [provider]  dicyclomine (BENTYL) 20 MG tablet Take 1 tablet (20 mg total) by mouth 2 (two) times daily as needed for spasms. Patient not taking: Reported on 06/25/2022 05/26/22   Sponseller, Eugene Gavia, PA-C  hydrocortisone (ANUSOL-HC) 2.5 % rectal cream Place 1 Application rectally 2 (two) times daily. 06/25/22   Doree Albee, PA-C  mesalamine (CANASA) 1000 MG suppository Place 1 suppository (1,000 mg total)  rectally at bedtime. use generic -GOOD RX BIN [id number] PCN: [id number] Group#: [id number] Member ID: [id number] 08/01/22   Armbruster, Willaim Rayas, MD    Family History Family History  Problem Relation Age of Onset   Diabetes Mother    Hypertension Mother    High Cholesterol Mother    Diabetes Father    Kidney disease Father    Hypertension Father    High Cholesterol Father    Heart attack Brother    Stroke Brother    Colon cancer Maternal Grandfather    Lung cancer Maternal Uncle     Social History Social History   Tobacco Use   Smoking status: Never   Smokeless tobacco: Never  Vaping Use   Vaping Use: Never used  Substance Use Topics   Alcohol use: Yes    Comment: social wine   Drug use: No     Allergies   Pollen extract   Review of Systems Review of Systems Per HPI  Physical Exam Triage Vital Signs ED Triage Vitals [03/12/23 1709]  Enc Vitals Group     BP (!) 136/94     Pulse Rate (!) 104     Resp 16     Temp 98.1 F (36.7 C)     Temp Source Oral     SpO2 98 %     Weight      Height      Head Circumference  Peak Flow      Pain Score 6     Pain Loc      Pain Edu?      Excl. in GC?    No data found.  Updated Vital Signs BP (!) 136/94 (BP Location: Left Arm)   Pulse (!) 104   Temp 98.1 F (36.7 C) (Oral)   Resp 16   SpO2 98%   Visual Acuity Right Eye Distance:   Left Eye Distance:   Bilateral Distance:    Right Eye Near:   Left Eye Near:    Bilateral Near:     Physical Exam Constitutional:      General: He is not in acute distress.    Appearance: Normal appearance. He is not toxic-appearing or diaphoretic.  HENT:     Head: Normocephalic and atraumatic.     Mouth/Throat:     Mouth: Mucous membranes are moist.     Pharynx: No posterior oropharyngeal erythema.  Eyes:     Extraocular Movements: Extraocular movements intact.     Conjunctiva/sclera: Conjunctivae normal.  Cardiovascular:     Rate and Rhythm: Normal rate and regular rhythm.      Pulses: Normal pulses.     Heart sounds: Normal heart sounds.  Pulmonary:     Effort: Pulmonary effort is normal. No respiratory distress.     Breath sounds: Normal breath sounds.  Abdominal:     General: Bowel sounds are normal. There is no distension.     Palpations: Abdomen is soft.     Tenderness: There is no abdominal tenderness.  Neurological:     General: No focal deficit present.     Mental Status: He is alert and oriented to person, place, and time. Mental status is at baseline.  Psychiatric:        Mood and Affect: Mood normal.        Behavior: Behavior normal.        Thought Content: Thought content normal.        Judgment: Judgment normal.      UC Treatments / Results  Labs (all labs ordered are listed, but only abnormal results are displayed) Labs Reviewed - No data to display  EKG   Radiology No results found.  Procedures Procedures (including critical care time)  Medications Ordered in UC Medications - No data to display  Initial Impression / Assessment and Plan / UC Course  I have reviewed the triage vital signs and the nursing notes.  Pertinent labs & imaging results that were available during my care of the patient were reviewed by me and considered in my medical decision making (see chart for details).     Suspect viral illness versus food related illness.  No signs of dehydration or acute abdomen on exam.  Patient mildly tachycardic on initial triage but this appears not very different from patient's baseline.  Advised adequate fluid hydration, rest, bland diet.  Nausea medication prescribed to take as needed.  Advised strict return and ER precautions if symptoms persist or worsen.  Patient verbalized understanding and was agreeable with plan. Final Clinical Impressions(s) / UC Diagnoses   Final diagnoses:  Nausea without vomiting  Diarrhea, unspecified type     Discharge Instructions      I have prescribed you a nausea medication to take  as needed.  Ensure adequate fluid hydration and bland diet as we discussed.  Follow-up if any symptoms persist or worsen.    ED Prescriptions     Medication Sig  Dispense Auth. Provider   ondansetron (ZOFRAN-ODT) 4 MG disintegrating tablet Take 1 tablet (4 mg total) by mouth every 8 (eight) hours as needed for nausea or vomiting. 20 tablet [place], Acie Fredrickson, [place]      PDMP not reviewed this encounter.   Gustavus Bryant, [place] 03/12/23 [phone]

## 2023-03-12 NOTE — Discharge Instructions (Signed)
I have prescribed you a nausea medication to take as needed.  Ensure adequate fluid hydration and bland diet as we discussed.  Follow-up if any symptoms persist or worsen.

## 2023-03-12 NOTE — ED Triage Notes (Signed)
Pt states diarrhea and abd pain since this morning.  States he thinks he has food poisoning.

## 2023-05-13 ENCOUNTER — Ambulatory Visit
Admission: EM | Admit: 2023-05-13 | Discharge: 2023-05-13 | Disposition: A | Discharge status: Other Acute Inpatient Hospital | Payer: Self-pay | Attending: Family Medicine | Admitting: Family Medicine

## 2023-05-13 ENCOUNTER — Emergency Department (HOSPITAL_COMMUNITY)
Admission: EM | Admit: 2023-05-13 | Discharge: 2023-05-13 | Disposition: A | Discharge status: Home / Self Care | Payer: Self-pay | Attending: Emergency Medicine | Admitting: Emergency Medicine

## 2023-05-13 ENCOUNTER — Other Ambulatory Visit: Payer: Self-pay

## 2023-05-13 ENCOUNTER — Encounter: Payer: Self-pay | Admitting: *Deleted

## 2023-05-13 ENCOUNTER — Emergency Department (HOSPITAL_COMMUNITY): Payer: Self-pay

## 2023-05-13 DIAGNOSIS — J449 Chronic obstructive pulmonary disease, unspecified: Secondary | ICD-10-CM | POA: Insufficient documentation

## 2023-05-13 DIAGNOSIS — I471 Supraventricular tachycardia, unspecified: Secondary | ICD-10-CM

## 2023-05-13 DIAGNOSIS — I10 Essential (primary) hypertension: Secondary | ICD-10-CM | POA: Insufficient documentation

## 2023-05-13 LAB — CBC WITH DIFFERENTIAL/PLATELET
Abs Immature Granulocytes: 0.01 10*3/uL (ref 0.00–0.07)
Basophils Absolute: 0 10*3/uL (ref 0.0–0.1)
Basophils Relative: 0 %
Eosinophils Absolute: 0 10*3/uL (ref 0.0–0.5)
Eosinophils Relative: 0 %
HCT: 44.6 % (ref 39.0–52.0)
Hemoglobin: 15.3 g/dL (ref 13.0–17.0)
Immature Granulocytes: 0 %
Lymphocytes Relative: 15 %
Lymphs Abs: 1.2 10*3/uL (ref 0.7–4.0)
MCH: 30.7 pg (ref 26.0–34.0)
MCHC: 34.3 g/dL (ref 30.0–36.0)
MCV: 89.4 fL (ref 80.0–100.0)
Monocytes Absolute: 0.4 10*3/uL (ref 0.1–1.0)
Monocytes Relative: 5 %
Neutro Abs: 6.3 10*3/uL (ref 1.7–7.7)
Neutrophils Relative %: 80 %
Platelets: 224 10*3/uL (ref 150–400)
RBC: 4.99 MIL/uL (ref 4.22–5.81)
RDW: 11.8 % (ref 11.5–15.5)
WBC: 7.9 10*3/uL (ref 4.0–10.5)
nRBC: 0 % (ref 0.0–0.2)

## 2023-05-13 LAB — COMPREHENSIVE METABOLIC PANEL
ALT: 53 U/L — ABNORMAL HIGH (ref 0–44)
AST: 34 U/L (ref 15–41)
Albumin: 3.9 g/dL (ref 3.5–5.0)
Alkaline Phosphatase: 84 U/L (ref 38–126)
Anion gap: 10 (ref 5–15)
BUN: 12 mg/dL (ref 6–20)
CO2: 25 mmol/L (ref 22–32)
Calcium: 9.4 mg/dL (ref 8.9–10.3)
Chloride: 103 mmol/L (ref 98–111)
Creatinine, Ser: 1.06 mg/dL (ref 0.61–1.24)
GFR, Estimated: >60 mL/min (ref >60)
Glucose, Bld: 110 mg/dL — ABNORMAL HIGH (ref 70–99)
Potassium: 3.9 mmol/L (ref 3.5–5.1)
Sodium: 138 mmol/L (ref 135–145)
Total Bilirubin: 0.7 mg/dL (ref 0.3–1.2)
Total Protein: 6.4 g/dL — ABNORMAL LOW (ref 6.5–8.1)

## 2023-05-13 LAB — D-DIMER, QUANTITATIVE: D-Dimer, Quant: <0.27 ug/mL-FEU (ref 0.00–0.50)

## 2023-05-13 LAB — TSH: TSH: 0.522 u[IU]/mL (ref 0.350–4.500)

## 2023-05-13 MED ORDER — SODIUM CHLORIDE 0.9 % IV BOLUS
1000.0000 mL | Freq: Once | INTRAVENOUS | Status: AC
  Administered 2023-05-13: 1000 mL via INTRAVENOUS

## 2023-05-13 NOTE — Discharge Instructions (Signed)
Please follow up with Cardiology. Avoid exercise and caffeine until you see Cardiology.

## 2023-05-13 NOTE — ED Triage Notes (Signed)
Pt BIB [facility] from urgent care due to palpitations.  Pt was found in SVT around 170 heart rate.  No cardiac history.  [facility] gave 12 adenosine.  Pt converted currently sinus tachy at 110.   VS BP 138/74, 18g right AC

## 2023-05-13 NOTE — ED Triage Notes (Signed)
PT reports he was in his bathroom about 10 mins ago and he felt like his heart was beating fast . Pt reports pressure. Pt felt like he was going to pass out.

## 2023-05-13 NOTE — ED Provider Notes (Signed)
[place] EMERGENCY DEPARTMENT AT [facility] Provider Note   CSN: [id number] Arrival date & time: 05/13/23  1625     History  SVT at [facility] care, converted to sinus tachycardia.    Garrett George is a 37 y.o. male PMH significant for COPD and Hypertension.  HPI  Was doing some push ups this morning stood up to go to the bathroom and felt chest palpitations and became diaphoretic. Was dizzy and went to urgent care. At urgent care it was found that he had an SVT converted to sinus tachycardia after vagal maneuvers EMS gave adenosine.     Home Medications Prior to Admission medications   Not on File      Allergies    Pollen extract    Review of Systems   Review of Systems  Physical Exam Updated Vital Signs BP (!) 156/118   Pulse (!) 106   Temp 98.8 F (37.1 C) (Oral)   Resp 13   SpO2 100%  Physical Exam  ED Results / Procedures / Treatments   Labs (all labs ordered are listed, but only abnormal results are displayed) Labs Reviewed  COMPREHENSIVE METABOLIC PANEL - Abnormal; Notable for the following components:      Result Value   Glucose, Bld 110 (*)    Total Protein 6.4 (*)    ALT 53 (*)    All other components within normal limits  CBC WITH DIFFERENTIAL/PLATELET  TSH  D-DIMER, QUANTITATIVE    EKG EKG Interpretation Date/Time:  Monday May 13 2023 16:30:52 EDT Ventricular Rate:  114 PR Interval:  166 QRS Duration:  82 QT Interval:  281 QTC Calculation: 387 R Axis:   82  Text Interpretation: Sinus tachycardia Borderline T wave abnormalities Confirmed by Blane Ohara [phone]) on 05/13/2023 4:46:31 PM  Radiology DG Chest Portable 1 View  Result Date: 05/13/2023 CLINICAL DATA:  svt EXAM: PORTABLE CHEST 1 VIEW COMPARISON:  CXR 10/05/12 FINDINGS: No pleural effusion. No pneumothorax. No focal airspace opacity. No radiographically apparent displaced rib fractures. Visualized upper is unremarkable. IMPRESSION: No focal airspace opacity  Electronically Signed   By: Lorenza Cambridge M.D.   On: 05/13/2023 19:59    Medications Ordered in ED Medications  sodium chloride 0.9 % bolus 1,000 mL (0 mLs Intravenous Stopped 05/13/23 1906)    ED Course/ Medical Decision Making/ A&P   Mr. Garrett George is a 37yo male with a history of COPD and Hypertension and FH significant for a heart attack on his brother who is in his 30s. He presented with SVT that converted to sinus tachycardia after vagal maneuvers at [facility]. [facility] have adenosine. Throughout the course of his ED admission he remained tachycardic. D-dimer was normal, denies any drug or caffeine use. Has been stressed recently due to his brother's admission to the hospital.  #SVT with sinus tachycardia  EKGs reviewed, consistent with SVT at Urgent care and Sinus tachycardia here at the ED  CXR and D-Dimer WNL  1L NS bolus. Na/K WNL No history of excessive caffeine or drug misuse/abuse. Denies having a PCP Follow-up with cardiology outpatient.     Final Clinical Impression(s) / ED Diagnoses Final diagnoses:  SVT (supraventricular tachycardia)    Rx / DC Orders ED Discharge Orders          Ordered    Ambulatory referral to Cardiology       Comments: If you have not heard from the Cardiology office within the next 72 hours please call [phone].  05/13/23 1826              Manuela Neptune, MD 05/13/23 2012

## 2023-05-13 NOTE — Discharge Instructions (Signed)
Patient will be transported to the emergency room for further evaluation and

## 2023-05-13 NOTE — ED Provider Notes (Addendum)
[facility]    CSN: [id number] Arrival date & time: 05/13/23  1519      History   Chief Complaint Chief Complaint  Patient presents with   Palpitations   Tachycardia    HPI Garrett George is a 37 y.o. male.    Palpitations  Here for heart racing that began about 10 minutes before arrival.  He felt dizzy earlier but has not had a syncopal episode.  No chest pain and no nausea or diaphoresis.    Past Medical History:  Diagnosis Date   HTN (hypertension)    Kidney stones    Sleep apnea with use of continuous positive airway pressure (CPAP)     There are no problems to display for this patient.   Past Surgical History:  Procedure Laterality Date   INGUINAL HERNIA REPAIR Left        Home Medications    Prior to Admission medications   Not on File    Family History Family History  Problem Relation Age of Onset   Diabetes Mother    Hypertension Mother    High Cholesterol Mother    Diabetes Father    Kidney disease Father    Hypertension Father    High Cholesterol Father    Heart attack Brother    Stroke Brother    Colon cancer Maternal Grandfather    Lung cancer Maternal Uncle     Social History Social History   Tobacco Use   Smoking status: Never   Smokeless tobacco: Never  Vaping Use   Vaping Use: Never used  Substance Use Topics   Alcohol use: Yes    Comment: social wine   Drug use: No     Allergies   Pollen extract   Review of Systems Review of Systems  Cardiovascular:  Positive for palpitations.     Physical Exam Triage Vital Signs ED Triage Vitals  Enc Vitals Group     BP 05/13/23 1525 115/79     Pulse Rate 05/13/23 1525 (!) 177     Resp 05/13/23 1525 20     Temp 05/13/23 1525 98 F (36.7 C)     Temp src --      SpO2 05/13/23 1525 97 %     Weight --      Height --      Head Circumference --      Peak Flow --      Pain Score 05/13/23 1527 9     Pain Loc --      Pain Edu? --      Excl. in GC? --     No data found.  Updated Vital Signs BP 115/79   Pulse (!) 177   Temp 98 F (36.7 C)   Resp 20   SpO2 97%   Visual Acuity Right Eye Distance:   Left Eye Distance:   Bilateral Distance:    Right Eye Near:   Left Eye Near:    Bilateral Near:     Physical Exam Vitals reviewed.  Constitutional:      General: He is not in acute distress.    Appearance: He is not ill-appearing, toxic-appearing or diaphoretic.  Cardiovascular:     Rate and Rhythm: Regular rhythm. Tachycardia present.  Pulmonary:     Effort: Pulmonary effort is normal.     Breath sounds: Normal breath sounds.  Skin:    Coloration: Skin is not jaundiced or pale.  Neurological:     General: No focal  deficit present.     Mental Status: He is alert and oriented to person, place, and time.  Psychiatric:        Behavior: Behavior normal.      UC Treatments / Results  Labs (all labs ordered are listed, but only abnormal results are displayed) Labs Reviewed - No data to display  EKG   Radiology No results found.  Procedures Procedures (including critical care time)  Medications Ordered in UC Medications - No data to display  Initial Impression / Assessment and Plan / UC Course  I have reviewed the triage vital signs and the nursing notes.  Pertinent labs & imaging results that were available during my care of the patient were reviewed by me and considered in my medical decision making (see chart for details).        Heart rate is 177 at triage and 172 on EKG, and rhythm is consistent with supraventricular tachycardia.  He attempted Valsalva to try to slow the heart rate.  After 2 attempts the tachycardia continued.   IV is placed.  EMS is called for transport to the emergency room for further evaluation and treatment. Final Clinical Impressions(s) / UC Diagnoses   Final diagnoses:  SVT (supraventricular tachycardia)     Discharge Instructions      Patient will be transported to  the emergency room for further evaluation and     ED Prescriptions   None    PDMP not reviewed this encounter.   Zenia Resides, MD 05/13/23 1534    Zenia Resides, MD 05/13/23 1540

## 2023-05-13 NOTE — ED Notes (Signed)
EMS arrived at bedside to transport to ED.

## 2023-08-12 ENCOUNTER — Ambulatory Visit (INDEPENDENT_AMBULATORY_CARE_PROVIDER_SITE_OTHER): Payer: Self-pay

## 2023-08-12 ENCOUNTER — Encounter: Payer: Self-pay | Admitting: Internal Medicine

## 2023-08-12 ENCOUNTER — Ambulatory Visit: Payer: Self-pay | Admitting: Internal Medicine

## 2023-08-12 ENCOUNTER — Ambulatory Visit: Payer: Self-pay | Attending: Internal Medicine | Admitting: Internal Medicine

## 2023-08-12 VITALS — BP 118/90 | HR 98 | Ht 69.0 in | Wt 179.4 lb

## 2023-08-12 DIAGNOSIS — G4733 Obstructive sleep apnea (adult) (pediatric): Secondary | ICD-10-CM

## 2023-08-12 DIAGNOSIS — R002 Palpitations: Secondary | ICD-10-CM

## 2023-08-12 DIAGNOSIS — I471 Supraventricular tachycardia, unspecified: Secondary | ICD-10-CM

## 2023-08-12 DIAGNOSIS — J342 Deviated nasal septum: Secondary | ICD-10-CM

## 2023-08-12 NOTE — Progress Notes (Signed)
Cardiology Office Note:  .    Date:  08/12/2023  ID:  Garrett George, DOB 31-Dec-1985, MRN [id number] PCP: Patient, No Pcp Per  [facility] Providers Cardiologist:  None     CC: Palpitations Consulted for the evaluation of SVT at the behest of Dr. Siri Cole  History of Present Illness: .    Garrett George is a 37 y.o. male with a history of palpitations who presents for evaluation.  Discussed the use of AI scribe software for clinical note transcription with the patient, who gave verbal consent to proceed.  History of Present Illness         Garrett George, a 37 year old male with a history of sleep apnea, presents for evaluation of suspected supraventricular tachycardia. He reports episodes of tachycardia, with heart rates reaching 130 bpm at rest, which he believes may be connected to his sleep apnea. He describes feeling as though he is about to pass out during these episodes, but denies any associated chest pain. He has been struggling with sleep apnea since he was 19, but was only diagnosed four years ago. He has been using a CPAP machine for management, but has been inconsistent due to issues with sinus congestion. He was recently told by another doctor that he has a deviated septum, which he believes may be contributing to his difficulty breathing with the CPAP machine. He also reports a cracking sensation in his chest, but it is unclear if this is related to his heart. He denies any other heart problems and has no family history of heart disease, though diabetes is prevalent in his family.No syncope.  No chest pain during the event. Has SVT with   Relevant histories: .  Social - back from [place], originally from [place] ROS: As per HPI.  Physical Exam:    VS:  BP (!) 118/90   Pulse 98   Ht 5\' 9"  (1.753 m)   Wt 179 lb 6.4 oz (81.4 kg)   SpO2 96%   BMI 26.49 kg/m    Wt Readings from Last 3 Encounters:  08/12/23 179 lb 6.4 oz (81.4 kg)  07/26/22 177 lb (80.3 kg)   07/12/22 181 lb (82.1 kg)    Gen: no distress,  Neck: No JVD Cardiac: No Rubs or Gallops, no murmur, RRR +2 radial pulses Respiratory: Clear to auscultation bilaterally, normal effort, normal  respiratory rate GI: Soft, nontender, non-distended  MS: No  edema;  moves all extremities Integument: Skin feels warm Neuro:  At time of evaluation, alert and oriented to person/place/time/situation  Psych: Normal affect, patient feels well   ASSESSMENT AND PLAN: .    Palpitations - Patient reports episodes of tachycardia with heart rate up to 130 bpm at rest. No documented SVT on previous EKGs. No associated chest pain. -Order a heart monitor for 1 week to capture any episodes of tachycardia and confirm if SVT is present.  Obstructive Sleep Apnea (OSA) - Longstanding history of OSA since age 76, currently managed with CPAP. Patient reports difficulty with CPAP use due to deviated septum and sinus issues. -offered referral to a sleep specialist for further management of OSA; he will see an non [facility] MD -Encourage patient to pursue surgery for deviated septum to potentially improve OSA and decrease risk of SVT.  Deviated Septum - Patient reports difficulty with CPAP use due to deviated septum. - no concerns about surgery   Riley Lam, MD FASE [facility] Cardiologist [facility]  [facility]  [address]  [address], [address] [place], [place] [address] [phone]  4:00 PM

## 2023-08-12 NOTE — Progress Notes (Unsigned)
Enrolled for [employer] to mail a ZIO XT long term holter monitor to the patients address on file.  

## 2023-08-12 NOTE — Patient Instructions (Addendum)
Medication Instructions:  Your physician recommends that you continue on your current medications as directed. Please refer to the Current Medication list given to you today.  *If you need a refill on your cardiac medications before your next appointment, please call your pharmacy*   Lab Work: NONE If you have labs (blood work) drawn today and your tests are completely normal, you will receive your results only by: MyChart Message (if you have MyChart) OR A paper copy in the mail If you have any lab test that is abnormal or we need to change your treatment, we will call you to review the results.   Testing/Procedures: Your physician has requested that you wear a heart monitor.    Follow-Up:As needed At [facility], you and your health needs are our priority.  As part of our continuing mission to provide you with exceptional heart care, we have created designated Provider Care Teams.  These Care Teams include your primary Cardiologist (physician) and Advanced Practice Providers (APPs -  Physician Assistants and Nurse Practitioners) who all work together to provide you with the care you need, when you need it.  We recommend signing up for the patient portal called "MyChart".  Sign up information is provided on this After Visit Summary.  MyChart is used to connect with patients for Virtual Visits (Telemedicine).  Patients are able to view lab/test results, encounter notes, upcoming appointments, etc.  Non-urgent messages can be sent to your provider as well.   To learn more about what you can do with MyChart, go to [email or web address].    OTHER INSTRUCTIONS ZIO XT- Long Term Monitor Instructions  Your physician has requested you wear a ZIO patch monitor for 7 days.  This is a single patch monitor. [employer] supplies one patch monitor per enrollment. Additional stickers are not available. Please do not apply patch if you will be having a Nuclear Stress Test,  Echocardiogram,  Cardiac CT, MRI, or Chest Xray during the period you would be wearing the  monitor. The patch cannot be worn during these tests. You cannot remove and re-apply the  ZIO XT patch monitor.  Your ZIO patch monitor will be mailed 3 day USPS to your address on file. It may take 3-5 days  to receive your monitor after you have been enrolled.  Once you have received your monitor, please review the enclosed instructions. Your monitor  has already been registered assigning a specific monitor serial # to you.  Billing and Patient Assistance Program Information  We have supplied [employer] with any of your insurance information on file for billing purposes. [employer] offers a sliding scale Patient Assistance Program for patients that do not have  insurance, or whose insurance does not completely cover the cost of the ZIO monitor.  You must apply for the Patient Assistance Program to qualify for this discounted rate.  To apply, please call [employer] at [phone], select option 4, select option 2, ask to apply for  Patient Assistance Program. Meredeth Ide will ask your household income, and how many people  are in your household. They will quote your out-of-pocket cost based on that information.  [employer] will also be able to set up a 11-month, interest-free payment plan if needed.  Applying the monitor   Shave hair from upper left chest.  Hold abrader disc by orange tab. Rub abrader in 40 strokes over the upper left chest as  indicated in your monitor instructions.  Clean area with 4 enclosed alcohol pads. Let dry.  Apply  patch as indicated in monitor instructions. Patch will be placed under collarbone on left  side of chest with arrow pointing upward.  Rub patch adhesive wings for 2 minutes. Remove white label marked "1". Remove the white  label marked "2". Rub patch adhesive wings for 2 additional minutes.  While looking in a mirror, press and release button in center of patch. A small green light will   flash 3-4 times. This will be your only indicator that the monitor has been turned on.  Do not shower for the first 24 hours. You may shower after the first 24 hours.  Press the button if you feel a symptom. You will hear a small click. Record Date, Time and  Symptom in the Patient Logbook.  When you are ready to remove the patch, follow instructions on the last 2 pages of Patient  Logbook. Stick patch monitor onto the last page of Patient Logbook.  Place Patient Logbook in the blue and white box. Use locking tab on box and tape box closed  securely. The blue and white box has prepaid postage on it. Please place it in the mailbox as  soon as possible. Your physician should have your test results approximately 7 days after the  monitor has been mailed back to [facility].  Call [facility] Customer Care at [phone] if you have questions regarding  your ZIO XT patch monitor. Call them immediately if you see an orange light blinking on your  monitor.  If your monitor falls off in less than 4 days, contact our Monitor department at [phone].  If your monitor becomes loose or falls off after 4 days call [employer] at [phone] for  suggestions on securing your monitor

## 2023-08-17 DIAGNOSIS — R002 Palpitations: Secondary | ICD-10-CM

## 2023-08-17 DIAGNOSIS — I471 Supraventricular tachycardia, unspecified: Secondary | ICD-10-CM

## 2023-08-17 DIAGNOSIS — G4733 Obstructive sleep apnea (adult) (pediatric): Secondary | ICD-10-CM

## 2023-08-19 ENCOUNTER — Telehealth: Payer: Self-pay | Admitting: Internal Medicine

## 2023-08-19 NOTE — Telephone Encounter (Signed)
Spoke with patient and stated that he placed zio monitor and he had an allergic reaction on his chest. He stated he tried to where monitor for as long as possible. He wore it for two days. He have taken the monitor off and is sending it back.

## 2023-08-19 NOTE — Telephone Encounter (Signed)
Called pt advised of monitor tech response.  Pt would like to have a sensitive skin monitor if has to wear another monitor.  Pt had no further questions or concerns.

## 2023-08-19 NOTE — Telephone Encounter (Signed)
Patient stated he had an allergic reaction to the [facility] monitor and wants a call back to discuss next steps.

## 2023-09-10 ENCOUNTER — Other Ambulatory Visit: Payer: Self-pay

## 2023-09-10 ENCOUNTER — Ambulatory Visit (INDEPENDENT_AMBULATORY_CARE_PROVIDER_SITE_OTHER): Payer: Self-pay

## 2023-09-10 ENCOUNTER — Ambulatory Visit
Admission: EM | Admit: 2023-09-10 | Discharge: 2023-09-10 | Disposition: A | Discharge status: Home / Self Care | Payer: Self-pay | Attending: Internal Medicine | Admitting: Internal Medicine

## 2023-09-10 DIAGNOSIS — G8929 Other chronic pain: Secondary | ICD-10-CM

## 2023-09-10 DIAGNOSIS — M25511 Pain in right shoulder: Secondary | ICD-10-CM

## 2023-09-10 DIAGNOSIS — R079 Chest pain, unspecified: Secondary | ICD-10-CM

## 2023-09-10 NOTE — ED Provider Notes (Signed)
[facility]    CSN: [id number] Arrival date & time: 09/10/23  1704      History   Chief Complaint Chief Complaint  Patient presents with   Shoulder Pain   Chest Injury    HPI Garrett George is a 37 y.o. male.   Patient presents with 2 different chief complaints today.  Patient reporting right shoulder pain for multiple years.  Denies any obvious injury.  Reports that he has had it evaluated before and was told to apply ice with no improvement.  Reports he has never had x-ray imaging of the area.  Reports that it "pops" when he moves it.  Also reporting left-sided chest pain for the past 3 weeks.  Reports that it has been present for a "long time" but it seemed to worsen over the past few weeks.  He is followed by cardiology which he saw approximately 1 month ago but states he never told the cardiologist that he had chest pain.  He had cardiac monitor as well and was told that it was normal.  Denies any associated shortness of breath, cough, headache, dizziness, nausea, vomiting, blurry vision.  Patient reports that he can feel a popping sensation in his chest at times.   Shoulder Pain   Past Medical History:  Diagnosis Date   HTN (hypertension)    Kidney stones    Sleep apnea with use of continuous positive airway pressure (CPAP)     Patient Active Problem List   Diagnosis Date Noted   SVT (supraventricular tachycardia) (HCC) 08/12/2023   OSA (obstructive sleep apnea) 08/12/2023   Deviated septum 08/12/2023   Palpitations 08/12/2023    Past Surgical History:  Procedure Laterality Date   INGUINAL HERNIA REPAIR Left        Home Medications    Prior to Admission medications   Medication Sig Start Date End Date Taking? Authorizing Provider  Ashwagandha (ASHWAGANDHA 35) 120 MG CAPS Take by mouth daily at 6 (six) AM.   Yes [provider]  cholecalciferol (VITAMIN D3) 25 MCG (1000 UNIT) tablet Take 1,000 Units by mouth daily.   Yes [provider]  Saccharomyces boulardii (PROBIOTIC) 250 MG CAPS Take by mouth daily at 6 (six) AM.   Yes [provider]  NON FORMULARY at bedtime. CPAP HS    [provider]    Family History Family History  Problem Relation Age of Onset   Diabetes Mother    Hypertension Mother    High Cholesterol Mother    Diabetes Father    Kidney disease Father    Hypertension Father    High Cholesterol Father    Heart attack Brother    Stroke Brother    Colon cancer Maternal Grandfather    Lung cancer Maternal Uncle     Social History Social History   Tobacco Use   Smoking status: Never   Smokeless tobacco: Never  Vaping Use   Vaping status: Never Used  Substance Use Topics   Alcohol use: Yes    Comment: social wine   Drug use: No     Allergies   Pollen extract   Review of Systems Review of Systems Per HPI  Physical Exam Triage Vital Signs ED Triage Vitals  Encounter Vitals Group     BP 09/10/23 1815 (!) 150/93     Systolic BP Percentile --      Diastolic BP Percentile --      Pulse Rate 09/10/23 1815 (!) 105  Resp 09/10/23 1815 18     Temp 09/10/23 1815 98.3 F (36.8 C)     Temp Source 09/10/23 1815 Oral     SpO2 09/10/23 1815 97 %     Weight --      Height --      Head Circumference --      Peak Flow --      Pain Score 09/10/23 1904 0     Pain Loc --      Pain Education --      Exclude from Growth Chart --    No data found.  Updated Vital Signs BP (!) 150/93 (BP Location: Left Arm)   Pulse (!) 105   Temp 98.3 F (36.8 C) (Oral)   Resp 18   SpO2 97%   Visual Acuity Right Eye Distance:   Left Eye Distance:   Bilateral Distance:    Right Eye Near:   Left Eye Near:    Bilateral Near:     Physical Exam Constitutional:      General: He is not in acute distress.    Appearance: Normal appearance. He is not toxic-appearing or diaphoretic.  HENT:     Head: Normocephalic and atraumatic.  Eyes:     Extraocular Movements:  Extraocular movements intact.     Conjunctiva/sclera: Conjunctivae normal.  Cardiovascular:     Rate and Rhythm: Normal rate and regular rhythm.     Pulses: Normal pulses.     Heart sounds: Normal heart sounds.  Pulmonary:     Effort: Pulmonary effort is normal. No respiratory distress.     Breath sounds: Normal breath sounds. No stridor. No wheezing, rhonchi or rales.  Chest:       Comments: Mild tenderness to palpation to the left anterior chest.  No swelling or discoloration noted. Musculoskeletal:     Comments: No tenderness to palpation to shoulder.  No swelling or discoloration.  No crepitus noted.  Patient reports pain is elicited with range of motion at about 45 degrees.  Grip strength 5/5.  Capillary refill and pulses intact.  Full range of motion of shoulder present.  Neurological:     General: No focal deficit present.     Mental Status: He is alert and oriented to person, place, and time. Mental status is at baseline.  Psychiatric:        Mood and Affect: Mood normal.        Behavior: Behavior normal.        Thought Content: Thought content normal.        Judgment: Judgment normal.      UC Treatments / Results  Labs (all labs ordered are listed, but only abnormal results are displayed) Labs Reviewed - No data to display  EKG   Radiology DG Shoulder Right  Result Date: 09/10/2023 CLINICAL DATA:  Right shoulder pain. EXAM: RIGHT SHOULDER - 2+ VIEW COMPARISON:  None Available. FINDINGS: There is no evidence of fracture or dislocation. Normal alignment and joint spaces. There is no evidence of arthropathy or other focal bone abnormality. Soft tissues are unremarkable. IMPRESSION: Negative radiographs of the right shoulder. Electronically Signed   By: Narda Rutherford M.D.   On: 09/10/2023 20:49    Procedures Procedures (including critical care time)  Medications Ordered in UC Medications - No data to display  Initial Impression / Assessment and Plan / UC Course   I have reviewed the triage vital signs and the nursing notes.  Pertinent labs & imaging results that were  available during my care of the patient were reviewed by me and considered in my medical decision making (see chart for details).     1.  Chest pain  Patient reports that chest pain has been present for "a long time" and EKG is normal sinus rhythm.  Vital signs are also stable so do not think that emergent evaluation is necessary at this time.  Advised patient to follow-up with his cardiologist as soon as possible to notify them that chest discomfort seems to be worsening.  I am mildly suspicious that chest pain could be musculoskeletal in nature given that it was reproducible with palpation as well.  Patient was also given strict ER precautions if chest pain significantly worsens.  Blood pressure slightly elevated today so encouraged patient to monitor it at home.  He states the cardiologist is aware of his blood pressure.  Also advised strict ER precautions regarding blood pressure today. There are no adventitious lung sounds on exam so chest imaging was deferred.   2.  Right shoulder pain  This shoulder pain appears to be chronic.  X-ray was completed that was negative for any acute bony abnormality.  Could be muscular in nature.  Given chronicity of pain, recommended that he follow-up with orthopedist at provided contact information.  In the meantime, advised supportive care and safe over-the-counter pain relievers for patient.  Patient verbalized understanding and was agreeable with plan. Final Clinical Impressions(s) / UC Diagnoses   Final diagnoses:  Chronic right shoulder pain  Chest pain, unspecified type     Discharge Instructions      I will call if x-ray results are abnormal.  Please follow-up with your cardiologist tomorrow to evaluate chest pain.  If chest pain persists or worsens, please go to the emergency department.    ED Prescriptions   None    PDMP not  reviewed this encounter.   Gustavus Bryant, [place] 09/11/23 1048

## 2023-09-10 NOTE — Discharge Instructions (Signed)
I will call if x-ray results are abnormal.  Please follow-up with your cardiologist tomorrow to evaluate chest pain.  If chest pain persists or worsens, please go to the emergency department.

## 2023-09-10 NOTE — ED Triage Notes (Signed)
Pt presents with chest pain x 3 weeks.  Pt reports shoulder pain for years.  Pt reports he does not due any heavy lifting at this time.

## 2023-10-02 ENCOUNTER — Ambulatory Visit: Payer: Self-pay | Admitting: Internal Medicine

## 2023-10-14 ENCOUNTER — Ambulatory Visit (INDEPENDENT_AMBULATORY_CARE_PROVIDER_SITE_OTHER): Payer: Self-pay | Admitting: Internal Medicine

## 2023-10-14 ENCOUNTER — Encounter: Payer: Self-pay | Admitting: Internal Medicine

## 2023-10-14 VITALS — BP 164/96 | HR 70 | Temp 98.3°F | Ht 69.0 in | Wt 184.0 lb

## 2023-10-14 DIAGNOSIS — R7303 Prediabetes: Secondary | ICD-10-CM

## 2023-10-14 DIAGNOSIS — E538 Deficiency of other specified B group vitamins: Secondary | ICD-10-CM

## 2023-10-14 DIAGNOSIS — G8929 Other chronic pain: Secondary | ICD-10-CM

## 2023-10-14 DIAGNOSIS — J342 Deviated nasal septum: Secondary | ICD-10-CM

## 2023-10-14 DIAGNOSIS — E559 Vitamin D deficiency, unspecified: Secondary | ICD-10-CM

## 2023-10-14 DIAGNOSIS — Z0001 Encounter for general adult medical examination with abnormal findings: Secondary | ICD-10-CM

## 2023-10-14 DIAGNOSIS — N200 Calculus of kidney: Secondary | ICD-10-CM | POA: Insufficient documentation

## 2023-10-14 DIAGNOSIS — G4730 Sleep apnea, unspecified: Secondary | ICD-10-CM

## 2023-10-14 DIAGNOSIS — M25511 Pain in right shoulder: Secondary | ICD-10-CM

## 2023-10-14 DIAGNOSIS — Z1159 Encounter for screening for other viral diseases: Secondary | ICD-10-CM

## 2023-10-14 DIAGNOSIS — Z114 Encounter for screening for human immunodeficiency virus [HIV]: Secondary | ICD-10-CM

## 2023-10-14 DIAGNOSIS — I10 Essential (primary) hypertension: Secondary | ICD-10-CM

## 2023-10-14 LAB — URINALYSIS, ROUTINE W REFLEX MICROSCOPIC
Bilirubin Urine: NEGATIVE
Hgb urine dipstick: NEGATIVE
Ketones, ur: NEGATIVE
Leukocytes,Ua: NEGATIVE
Nitrite: NEGATIVE
RBC / HPF: NONE SEEN (ref 0–?)
Specific Gravity, Urine: >=1.03 — AB (ref 1.000–1.030)
Total Protein, Urine: NEGATIVE
Urine Glucose: NEGATIVE
Urobilinogen, UA: 0.2 (ref 0.0–1.0)
pH: 6 (ref 5.0–8.0)

## 2023-10-14 LAB — BASIC METABOLIC PANEL
BUN: 15 mg/dL (ref 6–23)
CO2: 30 meq/L (ref 19–32)
Calcium: 9.6 mg/dL (ref 8.4–10.5)
Chloride: 104 meq/L (ref 96–112)
Creatinine, Ser: 1.1 mg/dL (ref 0.40–1.50)
GFR: 86.43 mL/min (ref >60.00)
Glucose, Bld: 101 mg/dL — ABNORMAL HIGH (ref 70–99)
Potassium: 4.1 meq/L (ref 3.5–5.1)
Sodium: 139 meq/L (ref 135–145)

## 2023-10-14 LAB — VITAMIN D 25 HYDROXY (VIT D DEFICIENCY, FRACTURES): VITD: 51.91 ng/mL (ref 30.00–100.00)

## 2023-10-14 LAB — CBC WITH DIFFERENTIAL/PLATELET
Basophils Absolute: 0 10*3/uL (ref 0.0–0.1)
Basophils Relative: 0.4 % (ref 0.0–3.0)
Eosinophils Absolute: 0.1 10*3/uL (ref 0.0–0.7)
Eosinophils Relative: 1.4 % (ref 0.0–5.0)
HCT: 45.6 % (ref 39.0–52.0)
Hemoglobin: 15.4 g/dL (ref 13.0–17.0)
Lymphocytes Relative: 45.2 % (ref 12.0–46.0)
Lymphs Abs: 1.9 10*3/uL (ref 0.7–4.0)
MCHC: 33.8 g/dL (ref 30.0–36.0)
MCV: 92.6 fL (ref 78.0–100.0)
Monocytes Absolute: 0.4 10*3/uL (ref 0.1–1.0)
Monocytes Relative: 9.7 % (ref 3.0–12.0)
Neutro Abs: 1.8 10*3/uL (ref 1.4–7.7)
Neutrophils Relative %: 43.3 % (ref 43.0–77.0)
Platelets: 220 10*3/uL (ref 150.0–400.0)
RBC: 4.93 Mil/uL (ref 4.22–5.81)
RDW: 12.9 % (ref 11.5–15.5)
WBC: 4.2 10*3/uL (ref 4.0–10.5)

## 2023-10-14 LAB — LIPID PANEL
Cholesterol: 175 mg/dL (ref 0–200)
HDL: 48.6 mg/dL (ref >39.00)
LDL Cholesterol: 115 mg/dL — ABNORMAL HIGH (ref 0–99)
NonHDL: 126.3
Total CHOL/HDL Ratio: 4
Triglycerides: 57 mg/dL (ref 0.0–149.0)
VLDL: 11.4 mg/dL (ref 0.0–40.0)

## 2023-10-14 LAB — TSH: TSH: 1.51 u[IU]/mL (ref 0.35–5.50)

## 2023-10-14 LAB — MICROALBUMIN / CREATININE URINE RATIO
Creatinine,U: 240.5 mg/dL
Microalb Creat Ratio: 0.3 mg/g (ref 0.0–30.0)
Microalb, Ur: 0.8 mg/dL (ref 0.0–1.9)

## 2023-10-14 LAB — HEPATIC FUNCTION PANEL
ALT: 19 U/L (ref 0–53)
AST: 16 U/L (ref 0–37)
Albumin: 4.5 g/dL (ref 3.5–5.2)
Alkaline Phosphatase: 96 U/L (ref 39–117)
Bilirubin, Direct: 0.2 mg/dL (ref 0.0–0.3)
Total Bilirubin: 0.8 mg/dL (ref 0.2–1.2)
Total Protein: 7.1 g/dL (ref 6.0–8.3)

## 2023-10-14 LAB — HEMOGLOBIN A1C: Hgb A1c MFr Bld: 5.6 % (ref 4.6–6.5)

## 2023-10-14 LAB — VITAMIN B12: Vitamin B-12: 216 pg/mL (ref 211–911)

## 2023-10-14 MED ORDER — DILTIAZEM HCL ER COATED BEADS 240 MG PO CP24: 240.0000 mg | ORAL_CAPSULE | Freq: Every day | ORAL | 3 refills | Status: DC

## 2023-10-14 NOTE — Progress Notes (Unsigned)
Patient ID: Garrett George, male   DOB: [birth year], 37 y.o.   MRN: [id number]         Chief Complaint:: wellness exam and htn, osa, deviated nasal septum, preDM, right shoulder pain       HPI:  Garrett George is a 37 y.o. male here for wellness exam; declines all immunizations, due for hep c and HIV screen, o/w up to date                        Also Pt denies chest pain, increased sob or doe, wheezing, orthopnea, PND, increased LE swelling, palpitations, dizziness or syncope, except for very mild fleeting left lateral chest pain after lifting recently.   Pt denies polydipsia, polyuria, or new focal neuro s/s.    Pt denies polydipsia, polyuria, or new focal neuro s/s. Does c/o ongoing fatigue, and has not been able to f/u with OSA and deviated nasal septum due to lack of insurance recently.  Also has > 1 yr onset right shoulder pain laterally worse to abduct, seen in ED recently with negative right shoulder film.     Wt Readings from Last 3 Encounters:  10/14/23 184 lb (83.5 kg)  08/12/23 179 lb 6.4 oz (81.4 kg)  07/26/22 177 lb (80.3 kg)   BP Readings from Last 3 Encounters:  10/14/23 (!) 164/96  09/10/23 (!) 150/93  08/12/23 (!) 118/90   There is no immunization history on file for this patient. Health Maintenance Due  Topic Date Due   HIV Screening  Never done   Hepatitis C Screening  Never done   DTaP/Tdap/Td (1 - Tdap) Never done   INFLUENZA VACCINE  Never done   COVID-19 Vaccine (1 - 2023-24 season) Never done      Past Medical History:  Diagnosis Date   HTN (hypertension)    Kidney stones    Sleep apnea with use of continuous positive airway pressure (CPAP)    Past Surgical History:  Procedure Laterality Date   INGUINAL HERNIA REPAIR Left     reports that he has never smoked. He has never used smokeless tobacco. He reports current alcohol use. He reports that he does not use drugs. family history includes Colon cancer in his maternal grandfather; Diabetes in his father  and mother; Heart attack in his brother; High Cholesterol in his father and mother; Hypertension in his father and mother; Kidney disease in his father; Lung cancer in his maternal uncle; Stroke in his brother. Allergies  Allergen Reactions   Pollen Extract Itching    Other reaction(s): Cough   Current Outpatient Medications on File Prior to Visit  Medication Sig Dispense Refill   Ashwagandha (ASHWAGANDHA 35) 120 MG CAPS Take by mouth daily at 6 (six) AM.     cholecalciferol (VITAMIN D3) 25 MCG (1000 UNIT) tablet Take 1,000 Units by mouth daily.     No current facility-administered medications on file prior to visit.        ROS:  All others reviewed and negative.  Objective        PE:  BP (!) 164/96 (BP Location: Left Arm, Patient Position: Sitting, Cuff Size: Normal)   Pulse 70   Temp 98.3 F (36.8 C) (Oral)   Ht 5\' 9"  (1.753 m)   Wt 184 lb (83.5 kg)   SpO2 100%   BMI 27.17 kg/m                 Constitutional: Pt appears  in NAD               HENT: Head: NCAT.                Right Ear: External ear normal.                 Left Ear: External ear normal.                Eyes: . Pupils are equal, round, and reactive to light. Conjunctivae and EOM are normal               Nose: without d/c or deformity               Neck: Neck supple. Gross normal ROM               Cardiovascular: Normal rate and regular rhythm.                 Pulmonary/Chest: Effort normal and breath sounds without rales or wheezing.                Abd:  Soft, NT, ND, + BS, no organomegaly               Neurological: Pt is alert. At baseline orientation, motor grossly intact               Skin: Skin is warm. No rashes, no other new lesions, LE edema - none               Psychiatric: Pt behavior is normal without agitation   Micro: none  Cardiac tracings I have personally interpreted today:  none  Pertinent Radiological findings (summarize): none   Lab Results  Component Value Date   WBC 4.2 10/14/2023    HGB 15.4 10/14/2023   HCT 45.6 10/14/2023   PLT 220.0 10/14/2023   GLUCOSE 101 (H) 10/14/2023   CHOL 175 10/14/2023   TRIG 57.0 10/14/2023   HDL 48.60 10/14/2023   LDLCALC 115 (H) 10/14/2023   ALT 19 10/14/2023   AST 16 10/14/2023   NA 139 10/14/2023   K 4.1 10/14/2023   CL 104 10/14/2023   CREATININE 1.10 10/14/2023   BUN 15 10/14/2023   CO2 30 10/14/2023   TSH 1.51 10/14/2023   HGBA1C 5.6 10/14/2023   MICROALBUR 0.8 10/14/2023   Assessment/Plan:  [name] is a 37 y.o. Black or African American [2] male with  has a past medical history of HTN (hypertension), Kidney stones, and Sleep apnea with use of continuous positive airway pressure (CPAP).  Encounter for well adult exam with abnormal findings Age and sex appropriate education and counseling updated with regular exercise and diet Referrals for preventative services - none needed Immunizations addressed - declines all immunizations Smoking counseling  - none needed Evidence for depression or other mood disorder - none significant Most recent labs reviewed. I have personally reviewed and have noted: 1) the patient's medical and social history 2) The patient's current medications and supplements 3) The patient's height, weight, and BMI have been recorded in the chart   Sleep apnea with use of continuous positive airway pressure (CPAP) Ongoing, had some dificulty with tolerating mask previously, now for referral pulmonary  Prediabetes Lab Results  Component Value Date   HGBA1C 5.6 10/14/2023   Stable, pt to continue current medical treatment  - diet, wt control  HTN (hypertension) BP Readings from Last 3 Encounters:  10/14/23 (!) 164/96  09/10/23 (!) 150/93  08/12/23 (!) 118/90   Uncontrolled, also hx of SVT - to start Card CD 240 every day, ROV 3 mo  Deviated septum Persistent, may need surgury - for ENT referral  Right shoulder pain Chronic persistent, recent xray neg, pt encouraged to make appt with  sport medicine  Followup: Return in about 3 months (around 01/12/2024).  Oliver Barre, MD 10/15/2023 6:04 AM [facility]

## 2023-10-14 NOTE — Patient Instructions (Signed)
Please take all new medication as prescribed - the diltiazem CD 240 mg per day  Please let us know in 1 wk by Mychart if not better than 130/80  Please continue all other medications as before, and refills have been done if requested.  Please have the pharmacy call with any other refills you may need.  Please continue your efforts at being more active, low cholesterol diet, and weight control.  You are otherwise up to date with prevention measures today.  Please keep your appointments with your specialists as you may have planned  You will be contacted regarding the referral for: Pulmonary, and ENT  Please also make appt on first floor for Sports Medicine for the right shoulder  Please go to the LAB at the blood drawing area for the tests to be done  You will be contacted by phone if any changes need to be made immediately.  Otherwise, you will receive a letter about your results with an explanation, but please check with MyChart first.  Please make an Appointment to return in 3 months, or sooner if needed

## 2023-10-15 ENCOUNTER — Encounter: Payer: Self-pay | Admitting: Internal Medicine

## 2023-10-15 DIAGNOSIS — M25511 Pain in right shoulder: Secondary | ICD-10-CM | POA: Insufficient documentation

## 2023-10-15 LAB — HEPATITIS C ANTIBODY: Hepatitis C Ab: NONREACTIVE

## 2023-10-15 LAB — HIV ANTIBODY (ROUTINE TESTING W REFLEX): HIV 1&2 Ab, 4th Generation: NONREACTIVE

## 2023-10-15 NOTE — Progress Notes (Unsigned)
    Aleen Sells D.[facility] [address] [place] [address] Phone: [phone]   Assessment and Plan:     There are no diagnoses linked to this encounter.  ***   Pertinent previous records reviewed include ***    Follow Up: ***     Subjective:   I, Ciarrah Rae, am serving as a Neurosurgeon for Doctor Richardean Sale  Chief Complaint: right shoulder pain   HPI:   10/16/2023 Patient is a 37 year old male with right shoulder pain. Patient states   Relevant Historical Information: ***  Additional pertinent review of systems negative.   Current Outpatient Medications:    Ashwagandha (ASHWAGANDHA 35) 120 MG CAPS, Take by mouth daily at 6 (six) AM., Disp: , Rfl:    cholecalciferol (VITAMIN D3) 25 MCG (1000 UNIT) tablet, Take 1,000 Units by mouth daily., Disp: , Rfl:    diltiazem (CARTIA XT) 240 MG 24 hr capsule, Take 1 capsule (240 mg total) by mouth daily., Disp: 90 capsule, Rfl: 3   Objective:     There were no vitals filed for this visit.    There is no height or weight on file to calculate BMI.    Physical Exam:    ***   Electronically signed by:  Aleen Sells D.[facility] 4:42 PM 10/15/23

## 2023-10-15 NOTE — Assessment & Plan Note (Signed)
Ongoing, had some dificulty with tolerating mask previously, now for referral pulmonary

## 2023-10-15 NOTE — Assessment & Plan Note (Signed)
Persistent, may need surgury - for ENT referral

## 2023-10-15 NOTE — Assessment & Plan Note (Signed)
Chronic persistent, recent xray neg, pt encouraged to make appt with sport medicine

## 2023-10-15 NOTE — Assessment & Plan Note (Signed)
Age and sex appropriate education and counseling updated with regular exercise and diet Referrals for preventative services - none needed Immunizations addressed - declines all immunizations Smoking counseling  - none needed Evidence for depression or other mood disorder - none significant Most recent labs reviewed. I have personally reviewed and have noted: 1) the patient's medical and social history 2) The patient's current medications and supplements 3) The patient's height, weight, and BMI have been recorded in the chart

## 2023-10-15 NOTE — Assessment & Plan Note (Signed)
Lab Results  Component Value Date   HGBA1C 5.6 10/14/2023   Stable, pt to continue current medical treatment  - diet, wt control

## 2023-10-15 NOTE — Assessment & Plan Note (Signed)
BP Readings from Last 3 Encounters:  10/14/23 (!) 164/96  09/10/23 (!) 150/93  08/12/23 (!) 118/90   Uncontrolled, also hx of SVT - to start Card CD 240 every day, ROV 3 mo

## 2023-10-16 ENCOUNTER — Ambulatory Visit (INDEPENDENT_AMBULATORY_CARE_PROVIDER_SITE_OTHER): Payer: Self-pay | Admitting: Sports Medicine

## 2023-10-16 VITALS — HR 83 | Ht 69.0 in | Wt 184.0 lb

## 2023-10-16 DIAGNOSIS — G8929 Other chronic pain: Secondary | ICD-10-CM

## 2023-10-16 DIAGNOSIS — M25511 Pain in right shoulder: Secondary | ICD-10-CM

## 2023-10-16 DIAGNOSIS — M7551 Bursitis of right shoulder: Secondary | ICD-10-CM

## 2023-10-16 MED ORDER — MELOXICAM 15 MG PO TABS: 15.0000 mg | ORAL_TABLET | Freq: Every day | ORAL | 0 refills | Status: AC

## 2023-10-16 NOTE — Patient Instructions (Signed)
-   Start meloxicam 15 mg daily x2 weeks.  If still having pain after 2 weeks, complete 3rd-week of NSAID. May use remaining NSAID as needed once daily for pain control.  Do not to use additional over-the-counter NSAIDs (ibuprofen, naproxen, Advil, Aleve) while taking prescription NSAIDs.  May use Tylenol [phone] mg 2 to 3 times a day for breakthrough pain. Shoulder HEP  As needed follow up, if pain doesn't resolve 4 week follow up

## 2023-10-18 ENCOUNTER — Ambulatory Visit (INDEPENDENT_AMBULATORY_CARE_PROVIDER_SITE_OTHER): Payer: Self-pay | Admitting: Adult Health

## 2023-10-18 VITALS — BP 150/82 | HR 87

## 2023-10-18 DIAGNOSIS — G4733 Obstructive sleep apnea (adult) (pediatric): Secondary | ICD-10-CM

## 2023-10-18 DIAGNOSIS — I471 Supraventricular tachycardia, unspecified: Secondary | ICD-10-CM

## 2023-10-18 DIAGNOSIS — I10 Essential (primary) hypertension: Secondary | ICD-10-CM

## 2023-10-18 NOTE — Patient Instructions (Addendum)
May try dreamwear nasal mask.  Restart CPAP At bedtime, wear all night long  Supplies -Tubing, mask, filters Try CPAP.com or [employer] for supplies  Try to get at least 6hr or more .  Work on healthy [employer]  Send a My Chart in 1 month with your sleep report.  Follow up in 3-4 months and As needed

## 2023-10-18 NOTE — Progress Notes (Signed)
@Patient  ID: Garrett George, male    DOB: 1986-07-05, 37 y.o.   MRN: [id number]  Chief Complaint  Patient presents with   Consult    Referring provider: Corwin Levins, MD  HPI: 37 year old male seen for sleep consult October 18, 2023 to establish for sleep apnea History of ulcerative colitis diet controlled    TEST/EVENTS :   10/18/2023 Sleep consult  Patient presents for sleep consult today.  Kindly referred by primary care provider Dr. Jonny Ruiz.  Discussed the use of AI scribe software for clinical note transcription with the patient, who gave verbal consent to proceed.  History of Present Illness   The patient, a 37 year old male with a history of severe sleep apnea diagnosed approximately nine years ago, presents for a sleep consultation. The patient reports a long-standing history of snoring, dating back to his early twenties. However, symptoms of daytime sleepiness have only manifested in the past couple of years daytime sleepiness and restless sleep.  Patient initially started CPAP felt that he benefited from CPAP but has been inconsistent in using over the years.  He has 2 CPAP machines a mini CPAP and a regular CPAP.  Currently using a fullface mask.  Patient does use the Airview app on his phone. Unfortunately patient does not have insurance and orders his supplies online through [employer].  The patient's sleep schedule is irregular due to his occupation as a Engineer, maintenance (IT), with sleep durations varying from three to seven hours per night. He denies the use of sleep aids and daytime napping. The patient has no history of congestive heart failure or stroke but reports a previous diagnosis of prediabetes, now resolved, and ongoing management of high blood pressure. He also mentions a single episode of supraventricular tachycardia (SVT) managed with daily diltiazem. Followed by Cardiology . The patient denies smoking, alcohol, and drug use.  The patient's experience with the CPAP  machine has been mixed. Initially, he found the machine beneficial for his sleep but was bothered by the marks left on his face by the mask. Over the past couple of years, he has experienced difficulty breathing with the machine, which he attributes to a  deviated septum.   The patient also reports a history of turbinate reduction surgery performed approximately nine years ago, around the same time he started using the CPAP machine. However, he did not find this surgery helpful in managing his sleep apnea symptoms.      Typically goes to bed between 1 AM and 5 AM.  Takes a long time to go to sleep.  Gets up at 12 PM.  Weight is up 10 pounds over the last 2 years.  Current weight is 184 pounds with a BMI of 27. Epworth score is 14 out of 24. Sleep study done August 25, 2015 showed severe sleep apnea with a AHI at 58.9/hour and SpO2 low at 82%.  He denies any history of stroke or congestive heart failure.  Does not use any sleep aids.  Does not nap.  Has no removable dental work.  Allergies  Allergen Reactions   Pollen Extract Itching    Other reaction(s): Cough     There is no immunization history on file for this patient.  Past Medical History:  Diagnosis Date   HTN (hypertension)    Kidney stones    Sleep apnea with use of continuous positive airway pressure (CPAP)   Medical history significant for chronic allergies and ulcerative colitis  Past Surgical History:  Procedure  Laterality Date   INGUINAL HERNIA REPAIR Left   Sinus surgery with turbinate reduction   Tobacco History: Social History   Tobacco Use  Smoking Status Never  Smokeless Tobacco Never   Counseling given: Not Answered   Outpatient Medications Prior to Visit  Medication Sig Dispense Refill   Ashwagandha (ASHWAGANDHA 35) 120 MG CAPS Take by mouth daily at 6 (six) AM.     cholecalciferol (VITAMIN D3) 25 MCG (1000 UNIT) tablet Take 1,000 Units by mouth daily.     diltiazem (CARTIA XT) 240 MG 24 hr capsule  Take 1 capsule (240 mg total) by mouth daily. (Patient not taking: Reported on 10/18/2023) 90 capsule 3   meloxicam (MOBIC) 15 MG tablet Take 1 tablet (15 mg total) by mouth daily. (Patient not taking: Reported on 10/18/2023) 30 tablet 0   No facility-administered medications prior to visit.     Review of Systems:   Constitutional:   No  weight loss, night sweats,  Fevers, chills, fatigue, or  lassitude.  HEENT:   No headaches,  Difficulty swallowing,  Tooth/dental problems, or  Sore throat,                No sneezing, itching, ear ache, nasal congestion, post nasal drip,   CV:  No chest pain,  Orthopnea, PND, swelling in lower extremities, anasarca, dizziness, palpitations, syncope.   GI  No heartburn, indigestion, abdominal pain, nausea, vomiting, diarrhea, change in bowel habits, loss of appetite, bloody stools.   Resp: No shortness of breath with exertion or at rest.  No excess mucus, no productive cough,  No non-productive cough,  No coughing up of blood.  No change in color of mucus.  No wheezing.  No chest wall deformity  Skin: no rash or lesions.  GU: no dysuria, change in color of urine, no urgency or frequency.  No flank pain, no hematuria   MS:  No joint pain or swelling.  No decreased range of motion.  No back pain.    Physical Exam  BP (!) 150/82 (BP Location: Left Arm, Cuff Size: Normal) Comment: states he just started taking bp meds yesterday  Pulse 87   SpO2 100%   GEN: A/Ox3; pleasant , NAD, well nourished    HEENT:  [place]/AT,  EACs-clear, TMs-wnl, NOSE-clear, THROAT-clear, no lesions, no postnasal drip or exudate noted.  Class III MP airway  NECK:  Supple w/ fair ROM; no JVD; normal carotid impulses w/o bruits; no thyromegaly or nodules palpated; no lymphadenopathy.    RESP  Clear  P & A; w/o, wheezes/ rales/ or rhonchi. no accessory muscle use, no dullness to percussion  CARD:  RRR, no m/r/g, no peripheral edema, pulses intact, no cyanosis or clubbing.  GI:    Soft & nt; nml bowel sounds; no organomegaly or masses detected.   Musco: Warm bil, no deformities or joint swelling noted.   Neuro: alert, no focal deficits noted.    Skin: Warm, no lesions or rashes    Lab Results:  CBC    Component Value Date/Time   WBC 4.2 10/14/2023 1440   RBC 4.93 10/14/2023 1440   HGB 15.4 10/14/2023 1440   HCT 45.6 10/14/2023 1440   PLT 220.0 10/14/2023 1440   MCV 92.6 10/14/2023 1440   MCH 30.7 05/13/2023 1750   MCHC 33.8 10/14/2023 1440   RDW 12.9 10/14/2023 1440   LYMPHSABS 1.9 10/14/2023 1440   MONOABS 0.4 10/14/2023 1440   EOSABS 0.1 10/14/2023 1440   BASOSABS 0.0 10/14/2023  1440    BMET    Component Value Date/Time   NA 139 10/14/2023 1440   K 4.1 10/14/2023 1440   CL 104 10/14/2023 1440   CO2 30 10/14/2023 1440   GLUCOSE 101 (H) 10/14/2023 1440   BUN 15 10/14/2023 1440   CREATININE 1.10 10/14/2023 1440   CALCIUM 9.6 10/14/2023 1440   GFRNONAA >60 05/13/2023 1750   GFRAA >90 10/05/2012 0904    BNP No results found for: "BNP"  ProBNP No results found for: "PROBNP"  Imaging: No results found.  Administration History     None           No data to display          No results found for: "NITRICOXIDE"      Assessment & Plan:  Assessment and Plan    Obstructive Sleep Apnea (OSA)   Diagnosed with severe OSA nine years ago,associated with  loud snoring, and significant daytime sleepiness. He has been non-compliant with CPAP due to discomfort and a deviated septum.  The importance of treating OSA to prevent complications such as congestive heart failure, hypertension, diabetes, and arrhythmias was emphasized. Treatment options including CPAP therapy, ENT evaluation, and the Inspire device were discussed. He prefers to retry CPAP before considering surgical options., recommend retrying CPAP with a new mask (Dreamwear Nasal), and order new CPAP supplies. Advised on maintaining a consistent sleep schedule and discussed  the potential for the Inspire device if CPAP is not tolerated. Follow-up in 3-4 months to assess CPAP efficacy and compliance is planned, with a MyChart message in one month to review CPAP data.  Hypertension   Currently managed well. We will continue the current management and monitor blood pressure regularly.  Supraventricular Tachycardia (SVT)   He experienced possible SVT once and is currently on diltiazem as prescribed by cardiology.    General Health Maintenance   We discussed the importance of maintaining a healthy weight, staying active, and practicing good sleep hygiene. Advised on maintaining a healthy weight, encouraged regular physical activity, and recommended a consistent sleep schedule.  Follow-up   Plan to follow up in 3-4 months and send a MyChart message in one month with CPAP data.          Rubye Oaks, NP 10/18/2023

## 2023-10-19 ENCOUNTER — Encounter: Payer: Self-pay | Admitting: Internal Medicine

## 2023-10-21 MED ORDER — TELMISARTAN 40 MG PO TABS
40.0000 mg | ORAL_TABLET | Freq: Every day | ORAL | 3 refills | Status: DC
Start: 2023-10-21 — End: 2023-10-24

## 2023-10-24 MED ORDER — BENAZEPRIL HCL 20 MG PO TABS: 20.0000 mg | ORAL_TABLET | Freq: Every day | ORAL | 3 refills | Status: DC

## 2023-10-24 NOTE — Addendum Note (Signed)
Addended by: Corwin Levins on: 10/24/2023 04:38 PM   Modules accepted: Orders

## 2024-01-16 ENCOUNTER — Ambulatory Visit: Payer: Self-pay | Admitting: Adult Health

## 2024-03-09 ENCOUNTER — Encounter: Payer: Self-pay | Admitting: *Deleted

## 2024-03-10 ENCOUNTER — Encounter: Payer: Self-pay | Admitting: Adult Health

## 2024-03-10 ENCOUNTER — Ambulatory Visit: Payer: Self-pay | Admitting: Adult Health

## 2024-03-10 VITALS — BP 134/80 | HR 94 | Ht 69.0 in | Wt 182.8 lb

## 2024-03-10 DIAGNOSIS — G4733 Obstructive sleep apnea (adult) (pediatric): Secondary | ICD-10-CM

## 2024-03-10 DIAGNOSIS — J310 Chronic rhinitis: Secondary | ICD-10-CM | POA: Insufficient documentation

## 2024-03-10 NOTE — Assessment & Plan Note (Signed)
 Chronic rhinitis.  May try Allegra and Nasonex daily.  Add saline nasal rinses twice daily.  Add saline nasal gel at bedtime.  Encouraged to follow back up with the ENT if symptoms do not improve or worsen

## 2024-03-10 NOTE — Assessment & Plan Note (Signed)
 Severe obstructive sleep apnea.  Patient education given on sleep apnea.  We went over several different treatment options including CPAP therapy and inspire device.  Will continue on CPAP therapy.  Changed to a nasal mask.  May like the ResMed N30 I mask.  Plan  Patient Instructions  May try Resmed N30i Nasal mask .  Wear CPAP At bedtime, wear all night long for  Try to get at least 6hr or more .  Work on healthy weight   Try Allegra 180mg  daily  Nasonex 2 puffs daily  Saline nasal rinses Twice daily   Saline nasal gel At bedtime   Follow up in 1 year and As needed

## 2024-03-10 NOTE — Progress Notes (Signed)
 @Patient  ID: Garrett George, male    DOB: 1986/05/08, 38 y.o.   MRN: [id number]  Chief Complaint  Patient presents with   Follow-up    Referring provider: Roslyn Coombe, MD  HPI: 38 year old male seen for sleep consult October 18, 2023 to establish for sleep apnea (diagnosed with sleep apnea around 2016) Medical history significant for ulcerative colitis, SVT.  TEST/EVENTS :  Sleep study done August 25, 2015 showed severe sleep apnea with a AHI at 58.9/hour and SpO2 low at 82%.    03/10/2024 Follow up ; OSA Patient returns for a 59-month follow-up.  Patient has a history of severe sleep apnea with sleep study in October 2016 showing AHI of 58.9/hour and SpO2 low at 82%.  Patient was started on CPAP therapy but has had difficulty tolerating.  Gets his supplies through [employer].  Last visit patient was recommended on CPAP compliance.  Since last visit patient has gotten new mask but a full face mask. . After he uses for few hours it starts to leak.  We had discussed changing to a nasal mask last visit but he was unable to get one.  We went over different mask options today.  He is getting supplies through [employer].  We did discuss changing to a ResMed N30 I nasal mask.  Also complains of chronic nasal congestion and drainage.  Patient uses the Airview app.  Shows good compliance with daily average usage around 5 to 6 hours.  AHI average 7-9/hour.  Patient says he does feel some better but feels that his sleep is very fragmented because the mask leaks and wakes him up frequently.  Also has problems tolerating due to chronic nasal congestion.    Allergies  Allergen Reactions   Pollen Extract Itching    Other reaction(s): Cough     There is no immunization history on file for this patient.  Past Medical History:  Diagnosis Date   HTN (hypertension)    Kidney stones    Sleep apnea with use of continuous positive airway pressure (CPAP)     Tobacco History: Social History   Tobacco Use   Smoking Status Never  Smokeless Tobacco Never   Counseling given: Not Answered   Outpatient Medications Prior to Visit  Medication Sig Dispense Refill   Ashwagandha (ASHWAGANDHA 35) 120 MG CAPS Take by mouth daily at 6 (six) AM.     benazepril  (LOTENSIN ) 20 MG tablet Take 1 tablet (20 mg total) by mouth daily. 90 tablet 3   cholecalciferol (VITAMIN D3) 25 MCG (1000 UNIT) tablet Take 1,000 Units by mouth daily.     meloxicam  (MOBIC ) 15 MG tablet Take 1 tablet (15 mg total) by mouth daily. 30 tablet 0   No facility-administered medications prior to visit.     Review of Systems:   Constitutional:   No  weight loss, night sweats,  Fevers, chills, +fatigue, or  lassitude.  HEENT:   No headaches,  Difficulty swallowing,  Tooth/dental problems, or  Sore throat,                No sneezing, itching, ear ache, +nasal congestion, post nasal drip,   CV:  No chest pain,  Orthopnea, PND, swelling in lower extremities, anasarca, dizziness, palpitations, syncope.   GI  No heartburn, indigestion, abdominal pain, nausea, vomiting, diarrhea, change in bowel habits, loss of appetite, bloody stools.   Resp: .  No chest wall deformity  Skin: no rash or lesions.  GU: no dysuria, change in  color of urine, no urgency or frequency.  No flank pain, no hematuria   MS:  No joint pain or swelling.  No decreased range of motion.  No back pain.    Physical Exam  BP 134/80 (BP Location: Left Arm, Patient Position: Sitting, Cuff Size: Large)   Pulse 94   Ht 5\' 9"  (1.753 m)   Wt 182 lb 12.8 oz (82.9 kg)   SpO2 99%   BMI 26.99 kg/m   GEN: A/Ox3; pleasant , NAD, well nourished    HEENT:  [place]/AT,   NOSE-clear, THROAT-clear, no lesions, no postnasal drip or exudate noted.  Class III MP airway  NECK:  Supple w/ fair ROM; no JVD; normal carotid impulses w/o bruits; no thyromegaly or nodules palpated; no lymphadenopathy.    RESP  Clear  P & A; w/o, wheezes/ rales/ or rhonchi. no accessory muscle use, no  dullness to percussion  CARD:  RRR, no m/r/g, no peripheral edema, pulses intact, no cyanosis or clubbing.  GI:   Soft & nt; nml bowel sounds; no organomegaly or masses detected.   Musco: Warm bil, no deformities or joint swelling noted.   Neuro: alert, no focal deficits noted.    Skin: Warm, no lesions or rashes    Lab Results:  CBC    Component Value Date/Time   WBC 4.2 10/14/2023 1440   RBC 4.93 10/14/2023 1440   HGB 15.4 10/14/2023 1440   HCT 45.6 10/14/2023 1440   PLT 220.0 10/14/2023 1440   MCV 92.6 10/14/2023 1440   MCH 30.7 05/13/2023 1750   MCHC 33.8 10/14/2023 1440   RDW 12.9 10/14/2023 1440   LYMPHSABS 1.9 10/14/2023 1440   MONOABS 0.4 10/14/2023 1440   EOSABS 0.1 10/14/2023 1440   BASOSABS 0.0 10/14/2023 1440    BMET    Component Value Date/Time   NA 139 10/14/2023 1440   K 4.1 10/14/2023 1440   CL 104 10/14/2023 1440   CO2 30 10/14/2023 1440   GLUCOSE 101 (H) 10/14/2023 1440   BUN 15 10/14/2023 1440   CREATININE 1.10 10/14/2023 1440   CALCIUM 9.6 10/14/2023 1440   GFRNONAA >60 05/13/2023 1750   GFRAA >90 10/05/2012 0904    BNP No results found for: "BNP"  ProBNP No results found for: "PROBNP"  Imaging: No results found.  Administration History     None           No data to display          No results found for: "NITRICOXIDE"      Assessment & Plan:   OSA (obstructive sleep apnea) Severe obstructive sleep apnea.  Patient education given on sleep apnea.  We went over several different treatment options including CPAP therapy and inspire device.  Will continue on CPAP therapy.  Changed to a nasal mask.  May like the ResMed N30 I mask.  Plan  Patient Instructions  May try Resmed N30i Nasal mask .  Wear CPAP At bedtime, wear all night long for  Try to get at least 6hr or more .  Work on healthy weight   Try Allegra 180mg  daily  Nasonex 2 puffs daily  Saline nasal rinses Twice daily   Saline nasal gel At bedtime    Follow up in 1 year and As needed      Chronic rhinitis Chronic rhinitis.  May try Allegra and Nasonex daily.  Add saline nasal rinses twice daily.  Add saline nasal gel at bedtime.  Encouraged to follow back  up with the ENT if symptoms do not improve or worsen     Roena Clark, NP 03/10/2024

## 2024-03-10 NOTE — Patient Instructions (Addendum)
 May try Resmed N30i Nasal mask .  Wear CPAP At bedtime, wear all night long for  Try to get at least 6hr or more .  Work on healthy weight   Try Allegra 180mg  daily  Nasonex 2 puffs daily  Saline nasal rinses Twice daily   Saline nasal gel At bedtime   Follow up in 1 year and As needed

## 2024-05-31 ENCOUNTER — Encounter: Payer: Self-pay | Admitting: Internal Medicine

## 2024-05-31 DIAGNOSIS — R194 Change in bowel habit: Secondary | ICD-10-CM

## 2024-06-03 NOTE — Addendum Note (Signed)
 Addended by: NORLEEN LYNWOOD ORN on: 06/03/2024 09:45 AM   Modules accepted: Orders

## 2024-06-08 NOTE — Telephone Encounter (Signed)
 There is no test other than colonoscopy and biopsy that I am aware.  And GI is who normally treats this   thanks

## 2024-07-01 ENCOUNTER — Ambulatory Visit
Admission: EM | Admit: 2024-07-01 | Discharge: 2024-07-01 | Disposition: A | Discharge status: Home / Self Care | Payer: Self-pay | Attending: Emergency Medicine | Admitting: Emergency Medicine

## 2024-07-01 DIAGNOSIS — R1084 Generalized abdominal pain: Secondary | ICD-10-CM

## 2024-07-01 NOTE — ED Provider Notes (Signed)
 [facility]    CSN: [id number] Arrival date & time: 07/01/24  1942      History   Chief Complaint Chief Complaint  Patient presents with   Abdominal Pain   Constipation    HPI Garrett George is a 38 y.o. male.  Patient with past history significant for ulcerative colitis presents to the urgent care with concerns abdominal pain.  Reports has had ongoing abdominal-progressively worsening last week with some associated headache.  Endorses nausea, muscle cramping, and bloody stool.  No reported fever, chills or bodyaches.   Abdominal Pain Associated symptoms: constipation   Constipation Associated symptoms: abdominal pain     Past Medical History:  Diagnosis Date   HTN (hypertension)    Kidney stones    Sleep apnea with use of continuous positive airway pressure (CPAP)     Patient Active Problem List   Diagnosis Date Noted   Chronic rhinitis 03/10/2024   Right shoulder pain 10/15/2023   Encounter for well adult exam with abnormal findings 10/14/2023   Prediabetes 10/14/2023   HTN (hypertension)    Kidney stones    Sleep apnea with use of continuous positive airway pressure (CPAP)    SVT (supraventricular tachycardia) (HCC) 08/12/2023   OSA (obstructive sleep apnea) 08/12/2023   Deviated septum 08/12/2023   Palpitations 08/12/2023   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 Authorizing Provider  Ashwagandha (ASHWAGANDHA 35) 120 MG CAPS Take by mouth daily at 6 (six) AM.    [provider]  benazepril  (LOTENSIN ) 20 MG tablet Take 1 tablet (20 mg total) by mouth daily. 10/24/23   Norleen Lynwood ORN, MD  cholecalciferol (VITAMIN D3) 25 MCG (1000 UNIT) tablet Take 1,000 Units by mouth daily.    [provider]  meloxicam  (MOBIC ) 15 MG tablet Take 1 tablet  (15 mg total) by mouth daily. 10/16/23   Leonce Katz, DO    Family History Family History  Problem Relation Age of Onset   Diabetes Mother    Hypertension Mother    High Cholesterol Mother    Diabetes Father    Kidney disease Father    Hypertension Father    High Cholesterol Father    Heart attack Brother    Stroke Brother    Colon cancer Maternal Grandfather    Lung cancer Maternal Uncle     Social History Social History   Tobacco Use   Smoking status: Never   Smokeless tobacco: Never  Vaping Use   Vaping status: Never Used  Substance Use Topics   Alcohol use: Yes    Comment: social wine   Drug use: No     Allergies   Pollen extract   Review of Systems Review of Systems  Gastrointestinal:  Positive for abdominal pain and constipation.  All other systems reviewed and are negative.    Physical Exam Triage Vital Signs ED Triage Vitals  Encounter Vitals Group     BP 07/01/24 1949 (!) 135/90     Girls Systolic BP Percentile --      Girls Diastolic BP Percentile --      Boys Systolic BP Percentile --      Boys Diastolic BP Percentile --      Pulse Rate 07/01/24 1949 (!) 115  Resp 07/01/24 1949 14     Temp 07/01/24 1949 98.5 F (36.9 C)     Temp src --      SpO2 07/01/24 1949 97 %     Weight --      Height --      Head Circumference --      Peak Flow --      Pain Score 07/01/24 1953 7     Pain Loc --      Pain Education --      Exclude from Growth Chart --    No data found.  Updated Vital Signs BP (!) 135/90   Pulse (!) 115   Temp 98.5 F (36.9 C)   Resp 14   SpO2 97%   Visual Acuity Right Eye Distance:   Left Eye Distance:   Bilateral Distance:    Right Eye Near:   Left Eye Near:    Bilateral Near:     Physical Exam Vitals and nursing note reviewed.  Constitutional:      General: He is not in acute distress.    Appearance: He is well-developed. He is not ill-appearing.  HENT:     Head: Normocephalic and atraumatic.   Eyes:     Conjunctiva/sclera: Conjunctivae normal.  Cardiovascular:     Rate and Rhythm: Normal rate and regular rhythm.     Heart sounds: No murmur heard. Pulmonary:     Effort: Pulmonary effort is normal. No respiratory distress.     Breath sounds: Normal breath sounds.  Abdominal:     General: Bowel sounds are normal.     Palpations: Abdomen is soft.     Tenderness: There is generalized abdominal tenderness. There is guarding.  Musculoskeletal:        General: No swelling.     Cervical back: Neck supple.  Skin:    General: Skin is warm and dry.     Capillary Refill: Capillary refill takes less than 2 seconds.  Neurological:     Mental Status: He is alert.  Psychiatric:        Mood and Affect: Mood normal.      UC Treatments / Results  Labs (all labs ordered are listed, but only abnormal results are displayed) Labs Reviewed  CYTOLOGY, (ORAL, ANAL, URETHRAL) ANCILLARY ONLY    EKG   Radiology No results found.  Procedures Procedures (including critical care time)  Medications Ordered in UC Medications - No data to display  Initial Impression / Assessment and Plan / UC Course  I have reviewed the triage vital signs and the nursing notes.  Pertinent labs & imaging results that were available during my care of the patient were reviewed by me and considered in my medical decision making (see chart for details).    Patient with a past history significant for ulcerative colitis.  He presents today with concerns of abdominal pain ongoing for the last week and progressively worsening.  No fever, chills, or bodyaches but does endorse some bloody bowel movements which do not escalate typical for her ulcerative colitis.  With progressively worsening abdominal pain last week and vitals showing tachycardia here at urgent care, concern for possible complication of ulcerative colitis.  Advised further workup in the emergency department for evaluation of patient's worsening  abdominal pain. Final Clinical Impressions(s) / UC Diagnoses   Final diagnoses:  Generalized abdominal pain     Discharge Instructions      Please seek further evaluation at [facility] Emergency Department.  [id number] [place], [place]     ED Prescriptions   None    PDMP not reviewed this encounter.   Luby Seamans A, PA-C 07/01/24 2012

## 2024-07-01 NOTE — ED Triage Notes (Addendum)
 Pt presents to uc with co generalized abdominal pain for one week with headaches. Endorses constipation, muscle spasms and nausea.   Pt requesting sti test

## 2024-07-01 NOTE — Discharge Instructions (Signed)
 Please seek further evaluation at [facility] Emergency Department.  [address] [place], [place]

## 2024-07-02 ENCOUNTER — Emergency Department (HOSPITAL_BASED_OUTPATIENT_CLINIC_OR_DEPARTMENT_OTHER)
Admission: EM | Admit: 2024-07-02 | Discharge: 2024-07-02 | Disposition: A | Discharge status: Home / Self Care | Payer: Self-pay | Attending: Emergency Medicine | Admitting: Emergency Medicine

## 2024-07-02 ENCOUNTER — Encounter (HOSPITAL_BASED_OUTPATIENT_CLINIC_OR_DEPARTMENT_OTHER): Payer: Self-pay

## 2024-07-02 ENCOUNTER — Other Ambulatory Visit: Payer: Self-pay

## 2024-07-02 DIAGNOSIS — R1012 Left upper quadrant pain: Secondary | ICD-10-CM | POA: Insufficient documentation

## 2024-07-02 DIAGNOSIS — R1010 Upper abdominal pain, unspecified: Secondary | ICD-10-CM

## 2024-07-02 DIAGNOSIS — I10 Essential (primary) hypertension: Secondary | ICD-10-CM | POA: Insufficient documentation

## 2024-07-02 DIAGNOSIS — R739 Hyperglycemia, unspecified: Secondary | ICD-10-CM | POA: Insufficient documentation

## 2024-07-02 DIAGNOSIS — R1013 Epigastric pain: Secondary | ICD-10-CM | POA: Insufficient documentation

## 2024-07-02 LAB — URINALYSIS, ROUTINE W REFLEX MICROSCOPIC
Bacteria, UA: NONE SEEN
Bilirubin Urine: NEGATIVE
Glucose, UA: NEGATIVE mg/dL
Hgb urine dipstick: NEGATIVE
Ketones, ur: NEGATIVE mg/dL
Leukocytes,Ua: NEGATIVE
Nitrite: NEGATIVE
Protein, ur: NEGATIVE mg/dL
Specific Gravity, Urine: 1.029 (ref 1.005–1.030)
pH: 5.5 (ref 5.0–8.0)

## 2024-07-02 LAB — COMPREHENSIVE METABOLIC PANEL WITH GFR
ALT: 14 U/L (ref 0–44)
AST: 16 U/L (ref 15–41)
Albumin: 4.6 g/dL (ref 3.5–5.0)
Alkaline Phosphatase: 119 U/L (ref 38–126)
Anion gap: 11 (ref 5–15)
BUN: 12 mg/dL (ref 6–20)
CO2: 28 mmol/L (ref 22–32)
Calcium: 10.3 mg/dL (ref 8.9–10.3)
Chloride: 103 mmol/L (ref 98–111)
Creatinine, Ser: 1.18 mg/dL (ref 0.61–1.24)
GFR, Estimated: >60 mL/min (ref >60)
Glucose, Bld: 142 mg/dL — ABNORMAL HIGH (ref 70–99)
Potassium: 3.5 mmol/L (ref 3.5–5.1)
Sodium: 142 mmol/L (ref 135–145)
Total Bilirubin: 0.5 mg/dL (ref 0.0–1.2)
Total Protein: 7.1 g/dL (ref 6.5–8.1)

## 2024-07-02 LAB — CBC
HCT: 45.5 % (ref 39.0–52.0)
Hemoglobin: 15.5 g/dL (ref 13.0–17.0)
MCH: 30.8 pg (ref 26.0–34.0)
MCHC: 34.1 g/dL (ref 30.0–36.0)
MCV: 90.3 fL (ref 80.0–100.0)
Platelets: 240 K/uL (ref 150–400)
RBC: 5.04 MIL/uL (ref 4.22–5.81)
RDW: 11.6 % (ref 11.5–15.5)
WBC: 6.5 K/uL (ref 4.0–10.5)
nRBC: 0 % (ref 0.0–0.2)

## 2024-07-02 LAB — CYTOLOGY, (ORAL, ANAL, URETHRAL) ANCILLARY ONLY
Chlamydia: NEGATIVE
Comment: NEGATIVE
Comment: NORMAL
Neisseria Gonorrhea: NEGATIVE

## 2024-07-02 LAB — LIPASE, BLOOD: Lipase: 36 U/L (ref 11–51)

## 2024-07-02 MED ORDER — SODIUM CHLORIDE 0.9 % IV BOLUS
1000.0000 mL | Freq: Once | INTRAVENOUS | Status: AC
  Administered 2024-07-02: 1000 mL via INTRAVENOUS

## 2024-07-02 MED ORDER — LIDOCAINE VISCOUS HCL 2 % MT SOLN
15.0000 mL | Freq: Once | OROMUCOSAL | Status: AC
  Administered 2024-07-02: 15 mL via ORAL
  Filled 2024-07-02: qty 15

## 2024-07-02 MED ORDER — MAALOX MAX 400-400-40 MG/5ML PO SUSP: 10.0000 mL | Freq: Four times a day (QID) | ORAL | Status: AC | PRN

## 2024-07-02 MED ORDER — ALUM & MAG HYDROXIDE-SIMETH 200-200-20 MG/5ML PO SUSP
30.0000 mL | Freq: Once | ORAL | Status: AC
  Administered 2024-07-02: 30 mL via ORAL
  Filled 2024-07-02: qty 30

## 2024-07-02 NOTE — ED Triage Notes (Signed)
 Pt presents via POV c/o abd pain and muscle spasms, and headaches. Reports some blood in stool a few days ago. Reports hx of Ulcerative Colitis.

## 2024-07-02 NOTE — ED Provider Notes (Signed)
 [place] EMERGENCY DEPARTMENT AT [facility] Provider Note  CSN: [id number] Arrival date & time: 07/02/24 0400  Chief Complaint(s) Abdominal Pain  HPI Garrett George is a 38 y.o. male with a past medical history listed below including reported ulcerative colitis not on mesalamine  here for 1+ week of upper abdominal pain worse with eating.  He denied any nausea or vomiting.  Reported streaky hematochezia noted on the toilet paper only several days ago but no overt bright red blood per rectum.  Bowel movements since then have been nonbloody.  No fevers or chills.  He is endorsing muscle spasming prompting his visit.  The history is provided by the patient.    Past Medical History Past Medical History:  Diagnosis Date   HTN (hypertension)    Kidney stones    Sleep apnea with use of continuous positive airway pressure (CPAP)    Patient Active Problem List   Diagnosis Date Noted   Chronic rhinitis 03/10/2024   Right shoulder pain 10/15/2023   Encounter for well adult exam with abnormal findings 10/14/2023   Prediabetes 10/14/2023   HTN (hypertension)    Kidney stones    Sleep apnea with use of continuous positive airway pressure (CPAP)    SVT (supraventricular tachycardia) (HCC) 08/12/2023   OSA (obstructive sleep apnea) 08/12/2023   Deviated septum 08/12/2023   Palpitations 08/12/2023   Nasal turbinate hypertrophy 08/06/2016   Home Medication(s) Prior to Admission medications   Medication Sig Start Date End Date Taking? Authorizing Provider  alum & mag hydroxide-simeth (MAALOX MAX) 400-400-40 MG/5ML suspension Take 10 mLs by mouth every 6 (six) hours as needed for indigestion. 07/02/24  Yes Antone Summons, Raynell Moder, MD  Ashwagandha (ASHWAGANDHA 35) 120 MG CAPS Take by mouth daily at 6 (six) AM.    [provider]  benazepril  (LOTENSIN ) 20 MG tablet Take 1 tablet (20 mg total) by mouth daily. 10/24/23   Norleen Lynwood ORN, MD  cholecalciferol (VITAMIN D3) 25 MCG (1000  UNIT) tablet Take 1,000 Units by mouth daily.    [provider]  meloxicam  (MOBIC ) 15 MG tablet Take 1 tablet (15 mg total) by mouth daily. 10/16/23   Leonce Katz, DO                                                                                                                                    Allergies Pollen extract  Review of Systems Review of Systems As noted in HPI  Physical Exam Vital Signs  I have reviewed the triage vital signs BP 127/82   Pulse 95   Temp 98 F (36.7 C) (Oral)   Resp 14   SpO2 98%   Physical Exam Vitals reviewed.  Constitutional:      General: He is not in acute distress.    Appearance: He is well-developed. He is not diaphoretic.  HENT:     Head: Normocephalic and atraumatic.  Right Ear: External ear normal.     Left Ear: External ear normal.     Nose: Nose normal.     Mouth/Throat:     Mouth: Mucous membranes are moist.  Eyes:     General: No scleral icterus.    Conjunctiva/sclera: Conjunctivae normal.  Neck:     Trachea: Phonation normal.  Cardiovascular:     Rate and Rhythm: Normal rate and regular rhythm.  Pulmonary:     Effort: Pulmonary effort is normal. No respiratory distress.     Breath sounds: No stridor.  Abdominal:     General: There is no distension.     Tenderness: There is abdominal tenderness (mild) in the epigastric area and left upper quadrant. There is no guarding or rebound.  Musculoskeletal:        General: Normal range of motion.     Cervical back: Normal range of motion.  Neurological:     Mental Status: He is alert and oriented to person, place, and time.  Psychiatric:        Behavior: Behavior normal.     ED Results and Treatments Labs (all labs ordered are listed, but only abnormal results are displayed) Labs Reviewed  COMPREHENSIVE METABOLIC PANEL WITH GFR - Abnormal; Notable for the following components:      Result Value   Glucose, Bld 142 (*)    All other components within normal  limits  LIPASE, BLOOD  CBC  URINALYSIS, ROUTINE W REFLEX MICROSCOPIC                                                                                                                         EKG  EKG Interpretation Date/Time:  Thursday July 02 2024 04:56:35 EDT Ventricular Rate:  91 PR Interval:  151 QRS Duration:  88 QT Interval:  328 QTC Calculation: 404 R Axis:   69  Text Interpretation: Sinus rhythm Borderline T wave abnormalities No significant change was found Confirmed by Trine Likes ([id number]) on 07/02/2024 4:58:51 AM       Radiology No results found.  Medications Ordered in ED Medications  alum & mag hydroxide-simeth (MAALOX/MYLANTA) 200-200-20 MG/5ML suspension 30 mL (30 mLs Oral Given 07/02/24 0446)    And  lidocaine  (XYLOCAINE ) 2 % viscous mouth solution 15 mL (15 mLs Oral Given 07/02/24 0446)  sodium chloride  0.9 % bolus 1,000 mL (1,000 mLs Intravenous New Bag/Given 07/02/24 0449)   Procedures Procedures  (including critical care time) Medical Decision Making / ED Course   Medical Decision Making Amount and/or Complexity of Data Reviewed Labs: ordered.  Risk OTC drugs. Prescription drug management.    Abdominal discomfort  Differential diagnosis considered  CBC without leukocytosis or anemia.  CMP without significant electrolyte derangements or renal sufficiency.  Mild hyperglycemia without DKA.  No evidence of bili obstruction or pancreatitis.  UA without evidence of infection.  Patient provided with IV fluids and GI cocktail to which patient reported relief.  Concern for serious intra-abdominal inflammatory/infectious process  is low at this time.  No need for emergent imaging.  Recommended close follow-up with PCP and GI    Final Clinical Impression(s) / ED Diagnoses Final diagnoses:  Upper abdominal pain   The patient appears reasonably screened and/or stabilized for discharge and I doubt any other medical condition or other [facility] requiring  further screening, evaluation, or treatment in the ED at this time. I have discussed the findings, Dx and Tx plan with the patient/family who expressed understanding and agree(s) with the plan. Discharge instructions discussed at length. The patient/family was given strict return precautions who verbalized understanding of the instructions. No further questions at time of discharge.  Disposition: Discharge  Condition: Good  ED Discharge Orders          Ordered    alum & mag hydroxide-simeth (MAALOX MAX) 400-400-40 MG/5ML suspension  Every 6 hours PRN        07/02/24 0529              Follow Up: Norleen Lynwood ORN, MD [address] [place] [address] [phone]  Call  to schedule an appointment for close follow up  Armbruster, Elspeth SQUIBB, MD [address] [place] [address] [place] [address] [phone]  Call  to schedule an appointment for close follow up     This chart was dictated using voice recognition software.  Despite best efforts to proofread,  errors can occur which can change the documentation meaning.    Trine Raynell Moder, MD 07/02/24 [phone]

## 2024-07-10 ENCOUNTER — Encounter: Payer: Self-pay | Admitting: Internal Medicine

## 2024-07-10 ENCOUNTER — Ambulatory Visit (INDEPENDENT_AMBULATORY_CARE_PROVIDER_SITE_OTHER): Payer: Self-pay | Admitting: Internal Medicine

## 2024-07-10 VITALS — BP 118/82 | HR 121 | Temp 98.8°F | Ht 69.0 in | Wt 170.8 lb

## 2024-07-10 DIAGNOSIS — K5190 Ulcerative colitis, unspecified, without complications: Secondary | ICD-10-CM

## 2024-07-10 DIAGNOSIS — R1013 Epigastric pain: Secondary | ICD-10-CM

## 2024-07-10 DIAGNOSIS — R7303 Prediabetes: Secondary | ICD-10-CM

## 2024-07-10 DIAGNOSIS — R1032 Left lower quadrant pain: Secondary | ICD-10-CM

## 2024-07-10 DIAGNOSIS — I10 Essential (primary) hypertension: Secondary | ICD-10-CM

## 2024-07-10 MED ORDER — SUCRALFATE 1 G PO TABS: 1.0000 g | ORAL_TABLET | Freq: Four times a day (QID) | ORAL | 0 refills | Status: AC

## 2024-07-10 MED ORDER — PANTOPRAZOLE SODIUM 40 MG PO TBEC: 40.0000 mg | DELAYED_RELEASE_TABLET | Freq: Every day | ORAL | 3 refills | Status: AC

## 2024-07-10 NOTE — Progress Notes (Signed)
 Patient ID: Garrett George, male   DOB: 1986-07-20, 38 y.o.   MRN: [id number]        Chief Complaint: follow up epigastric and LLQ pain, ulcerative colitis, preDM        HPI:  Garrett George is a 38 y.o. male here with c/o acute onset worsening epigastric and LLQ pain  with HA, low appetite but no fever, chills.  Denies worsening reflux, abd pain, dysphagia, n/v, bowel change or blood. Seen at ED aug 21 ,  No imaging felt needed at this time.  Tx with prednisone course and is currently finishing this but with little improvement.  Pain remains however, with mild nausea.  Denies worsening dysphagia, vomiting, bowel change or blood.  Pt has appt with GI soon.  Pt denies chest pain, increased sob or doe, wheezing, orthopnea, PND, increased LE swelling, palpitations, dizziness or syncope.   Pt denies polydipsia, polyuria, or new focal neuro s/s.        Wt Readings from Last 3 Encounters:  07/10/24 170 lb 12.8 oz (77.5 kg)  03/10/24 182 lb 12.8 oz (82.9 kg)  10/16/23 184 lb (83.5 kg)   BP Readings from Last 3 Encounters:  07/10/24 118/82  07/02/24 (!) 133/95  07/01/24 (!) 135/90         Past Medical History:  Diagnosis Date   HTN (hypertension)    Kidney stones    Sleep apnea with use of continuous positive airway pressure (CPAP)    Past Surgical History:  Procedure Laterality Date   INGUINAL HERNIA REPAIR Left     reports that he has never smoked. He has never used smokeless tobacco. He reports current alcohol use. He reports that he does not use drugs. family history includes Colon cancer in his maternal grandfather; Diabetes in his father and mother; Heart attack in his brother; High Cholesterol in his father and mother; Hypertension in his father and mother; Kidney disease in his father; Lung cancer in his maternal uncle; Stroke in his brother. Allergies  Allergen Reactions   Pollen Extract Itching    Other reaction(s): Cough   Current Outpatient Medications on File Prior to Visit   Medication Sig Dispense Refill   alum & mag hydroxide-simeth (MAALOX MAX) 400-400-40 MG/5ML suspension Take 10 mLs by mouth every 6 (six) hours as needed for indigestion.     Ashwagandha (ASHWAGANDHA 35) 120 MG CAPS Take by mouth daily at 6 (six) AM.     benazepril  (LOTENSIN ) 20 MG tablet Take 1 tablet (20 mg total) by mouth daily. 90 tablet 3   cholecalciferol (VITAMIN D3) 25 MCG (1000 UNIT) tablet Take 1,000 Units by mouth daily.     meloxicam  (MOBIC ) 15 MG tablet Take 1 tablet (15 mg total) by mouth daily. 30 tablet 0   No current facility-administered medications on file prior to visit.        ROS:  All others reviewed and negative.  Objective        PE:  BP 118/82   Pulse (!) 121   Temp 98.8 F (37.1 C)   Ht 5' 9 (1.753 m)   Wt 170 lb 12.8 oz (77.5 kg)   SpO2 99%   BMI 25.22 kg/m                 Constitutional: Pt appears in pain               HENT: Head: NCAT.  Right Ear: External ear normal.                 Left Ear: External ear normal.                Eyes: . Pupils are equal, round, and reactive to light. Conjunctivae and EOM are normal               Nose: without d/c or deformity               Neck: Neck supple. Gross normal ROM               Cardiovascular: Normal rate and regular rhythm.                 Pulmonary/Chest: Effort normal and breath sounds without rales or wheezing.                Abd:  Soft, epigstric and LLQ tender,  ND, + BS, no organomegaly               Neurological: Pt is alert. At baseline orientation, motor grossly intact               Skin: Skin is warm. No rashes, no other new lesions, LE edema - none               Psychiatric: Pt behavior is normal without agitation   Micro: none  Cardiac tracings I have personally interpreted today:  none  Pertinent Radiological findings (summarize): none   Lab Results  Component Value Date   WBC 6.5 07/02/2024   HGB 15.5 07/02/2024   HCT 45.5 07/02/2024   PLT 240 07/02/2024    GLUCOSE 142 (H) 07/02/2024   CHOL 175 10/14/2023   TRIG 57.0 10/14/2023   HDL 48.60 10/14/2023   LDLCALC 115 (H) 10/14/2023   ALT 14 07/02/2024   AST 16 07/02/2024   NA 142 07/02/2024   K 3.5 07/02/2024   CL 103 07/02/2024   CREATININE 1.18 07/02/2024   BUN 12 07/02/2024   CO2 28 07/02/2024   TSH 1.51 10/14/2023   HGBA1C 5.6 10/14/2023   Assessment/Plan:  Garrett George is a 38 y.o. Black or African American [2] male with  has a past medical history of HTN (hypertension), Kidney stones, and Sleep apnea with use of continuous positive airway pressure (CPAP).  Ulcerative colitis (HCC) Atypical but agree with prednisone, pt to finish, f/u GI as referred  Epigastric pain Can't r/o gastritis - for trial protonix  40 mg every day, carafate  qid x 1 mo  LLQ pain Also for CT abd pelvis r/o other such as diverticulitis  Prediabetes Lab Results  Component Value Date   HGBA1C 5.6 10/14/2023   Stable, pt to continue current medical treatment  - diet, wt control   HTN (hypertension) BP Readings from Last 3 Encounters:  07/10/24 118/82  07/02/24 (!) 133/95  07/01/24 (!) 135/90   Stable, pt to continue medical treatment lotensin  20 mg qd  Followup: Return in about 6 months (around 01/09/2025).  Lynwood Rush, MD 07/11/2024 9:18 PM [facility]

## 2024-07-10 NOTE — Patient Instructions (Addendum)
 Please finish the prednisone as you are  Please take all new medication as prescribed - the protonix  40 mg per day for acid, and carafate  to coat the stomach to healing  Please continue all other medications as before, and refills have been done if requested.  Please have the pharmacy call with any other refills you may need.  Please continue your efforts at being more active, low cholesterol diet, and weight control.  You are otherwise up to date with prevention measures today.  Please keep your appointments with your specialists as you may have planned  You will be contacted regarding the referral for: CT abd/pelvis  Please make sure to follow with GI as you have been referred  Please make an Appointment to return in 6 months, or sooner if needed

## 2024-07-11 ENCOUNTER — Encounter: Payer: Self-pay | Admitting: Internal Medicine

## 2024-07-11 DIAGNOSIS — R1032 Left lower quadrant pain: Secondary | ICD-10-CM | POA: Insufficient documentation

## 2024-07-11 DIAGNOSIS — K5190 Ulcerative colitis, unspecified, without complications: Secondary | ICD-10-CM | POA: Insufficient documentation

## 2024-07-11 DIAGNOSIS — R1013 Epigastric pain: Secondary | ICD-10-CM | POA: Insufficient documentation

## 2024-07-11 NOTE — Assessment & Plan Note (Addendum)
 Atypical but agree with prednisone, pt to finish, f/u GI as referred

## 2024-07-11 NOTE — Assessment & Plan Note (Signed)
 Can't r/o gastritis - for trial protonix  40 mg every day, carafate  qid x 1 mo

## 2024-07-11 NOTE — Assessment & Plan Note (Signed)
 Lab Results  Component Value Date   HGBA1C 5.6 10/14/2023   Stable, pt to continue current medical treatment  - diet, wt control

## 2024-07-11 NOTE — Assessment & Plan Note (Signed)
 BP Readings from Last 3 Encounters:  07/10/24 118/82  07/02/24 (!) 133/95  07/01/24 (!) 135/90   Stable, pt to continue medical treatment lotensin  20 mg qd

## 2024-07-11 NOTE — Assessment & Plan Note (Signed)
 Also for CT abd pelvis r/o other such as diverticulitis

## 2024-07-15 ENCOUNTER — Encounter: Payer: Self-pay | Admitting: Internal Medicine

## 2024-07-15 ENCOUNTER — Ambulatory Visit (HOSPITAL_COMMUNITY): Payer: Self-pay

## 2024-07-15 ENCOUNTER — Other Ambulatory Visit: Payer: Self-pay | Admitting: Internal Medicine

## 2024-07-15 ENCOUNTER — Other Ambulatory Visit (HOSPITAL_COMMUNITY): Payer: Self-pay

## 2024-07-15 ENCOUNTER — Encounter (HOSPITAL_COMMUNITY): Payer: Self-pay

## 2024-07-15 DIAGNOSIS — R1032 Left lower quadrant pain: Secondary | ICD-10-CM

## 2024-07-15 DIAGNOSIS — K529 Noninfective gastroenteritis and colitis, unspecified: Secondary | ICD-10-CM

## 2024-07-15 DIAGNOSIS — A09 Infectious gastroenteritis and colitis, unspecified: Secondary | ICD-10-CM

## 2024-07-15 NOTE — Telephone Encounter (Signed)
 Unfortunately, since the blood tests were ok aug 21, we would not need that.  I dont have other treatnent, but I can check the C diff stool test to make sure it is not that.

## 2024-07-28 ENCOUNTER — Other Ambulatory Visit: Payer: Self-pay | Admitting: Internal Medicine

## 2024-07-28 ENCOUNTER — Other Ambulatory Visit: Payer: Self-pay

## 2024-07-29 ENCOUNTER — Other Ambulatory Visit: Payer: Self-pay

## 2024-07-29 DIAGNOSIS — R1032 Left lower quadrant pain: Secondary | ICD-10-CM

## 2024-07-29 DIAGNOSIS — K529 Noninfective gastroenteritis and colitis, unspecified: Secondary | ICD-10-CM

## 2024-07-29 DIAGNOSIS — A09 Infectious gastroenteritis and colitis, unspecified: Secondary | ICD-10-CM

## 2024-07-30 ENCOUNTER — Other Ambulatory Visit: Payer: Self-pay | Admitting: Internal Medicine

## 2024-07-30 ENCOUNTER — Ambulatory Visit: Payer: Self-pay | Admitting: Internal Medicine

## 2024-07-30 LAB — C. DIFFICILE GDH AND TOXIN A/B
GDH ANTIGEN: DETECTED
TOXIN A AND B: DETECTED

## 2024-07-30 MED ORDER — VANCOMYCIN HCL 125 MG PO CAPS: 125.0000 mg | ORAL_CAPSULE | Freq: Four times a day (QID) | ORAL | 0 refills | Status: AC

## 2024-07-31 LAB — GASTROINTESTINAL PATHOGEN PNL
CampyloBacter Group: NOT DETECTED
Norovirus GI/GII: NOT DETECTED
Rotavirus A: NOT DETECTED
Salmonella species: NOT DETECTED
Shiga Toxin 1: NOT DETECTED
Shiga Toxin 2: NOT DETECTED
Shigella Species: NOT DETECTED
Vibrio Group: NOT DETECTED
Yersinia enterocolitica: NOT DETECTED

## 2024-08-12 NOTE — Telephone Encounter (Signed)
 Ok to finish current medication, and f/u with GI as planned.  We would only need to treat if any worsening again such as fever, abd pain, diarrhea or blood or other worsening symptoms

## 2024-08-14 NOTE — Telephone Encounter (Signed)
 Ok to contact pt.  We normally dont have to treat more than the 10 days, although I suppose there might always be exceptions.   Please ask pt to f/u in office due to unusual and complicated illness and need to determine if needs other tx   thanks

## 2024-08-18 NOTE — Telephone Encounter (Signed)
 Called and scheduled Pt with next available provider.

## 2024-08-19 ENCOUNTER — Ambulatory Visit: Payer: Self-pay | Admitting: Emergency Medicine

## 2024-08-24 ENCOUNTER — Ambulatory Visit: Payer: Self-pay | Admitting: Emergency Medicine

## 2024-08-27 ENCOUNTER — Ambulatory Visit: Payer: Self-pay | Admitting: Emergency Medicine

## 2024-08-31 ENCOUNTER — Ambulatory Visit: Payer: Self-pay | Admitting: Emergency Medicine

## 2024-09-04 ENCOUNTER — Encounter: Payer: Self-pay | Admitting: Internal Medicine

## 2024-10-27 ENCOUNTER — Other Ambulatory Visit: Payer: Self-pay | Admitting: Internal Medicine

## 2024-10-28 ENCOUNTER — Encounter: Payer: Self-pay | Admitting: Internal Medicine

## 2024-10-28 ENCOUNTER — Other Ambulatory Visit: Payer: Self-pay | Admitting: Internal Medicine

## 2024-10-28 DIAGNOSIS — K529 Noninfective gastroenteritis and colitis, unspecified: Secondary | ICD-10-CM

## 2024-11-09 ENCOUNTER — Other Ambulatory Visit: Payer: Self-pay

## 2024-11-09 DIAGNOSIS — K529 Noninfective gastroenteritis and colitis, unspecified: Secondary | ICD-10-CM

## 2024-11-09 NOTE — Addendum Note (Signed)
 Addended by: EMERY PRESIDENT D on: 11/09/2024 04:48 PM   Modules accepted: Orders

## 2024-11-10 ENCOUNTER — Ambulatory Visit: Payer: Self-pay | Admitting: Internal Medicine

## 2024-11-10 LAB — C. DIFFICILE GDH AND TOXIN A/B
GDH ANTIGEN: DETECTED
MICRO NUMBER:: 17404445
SPECIMEN QUALITY:: ADEQUATE
TOXIN A AND B: NOT DETECTED

## 2024-11-10 LAB — CLOSTRIDIUM DIFFICILE TOXIN B, QUALITATIVE, REAL-TIME PCR: Toxigenic C. Difficile by PCR: NOT DETECTED

## 2024-11-23 ENCOUNTER — Ambulatory Visit: Payer: Self-pay

## 2024-11-23 NOTE — Telephone Encounter (Signed)
" °  FYI Only or Action Required?: FYI only for provider: ED advised.  Patient was last seen in primary care on 07/10/2024 by Norleen Lynwood ORN, MD.  Called Nurse Triage reporting Cough.  Symptoms began 2 weeks ago.  Interventions attempted: Nothing.  Symptoms are: stable.  Triage Disposition: No disposition on file.  Patient/caregiver understands and will follow disposition?:   Copied from CRM [id number]. Topic: Clinical - Red Word Triage >> Nov 23, 2024  1:51 PM Tinnie C wrote: Red Word that prompted transfer to Nurse Triage: coughing up dark green phlegm, hard to swallow and sore neck. Reason for Disposition  [1] Chest pain lasts > 5 minutes AND [2] age > 30 AND [3] one or more cardiac risk factors (e.g., diabetes, high blood pressure, high cholesterol, obesity with BMI 30 or higher, smoker, or strong family history of heart disease)  Answer Assessment - Initial Assessment Questions Has had cough for past few weeks that's improved. Today states has worsening CP on left side over past week. Denies SOB. Declines 911, will have mother take him in  1. LOCATION: Where does it hurt?       Left side 2. RADIATION: Does the pain go anywhere else? (e.g., into neck, jaw, arms, back)     Arm and back pain at times 3. ONSET: When did the chest pain begin? (Minutes, hours or days)      Last week 4. PATTERN: Does the pain come and go, or has it been constant since it started?  Does it get worse with exertion?      constant 5. DURATION: How long does it last (e.g., seconds, minutes, hours)     constant 6. SEVERITY: How bad is the pain?  (e.g., Scale 1-10; mild, moderate, or severe)     5/10 7. CARDIAC RISK FACTORS: Do you have any history of heart problems or risk factors for heart disease? (e.g., angina, prior heart attack; diabetes, high blood pressure, high cholesterol, smoker, or strong family history of heart disease)     HTN 8. PULMONARY RISK FACTORS: Do you have any history of  lung disease?  (e.g., blood clots in lung, asthma, emphysema, birth control pills)     no 9. CAUSE: What do you think is causing the chest pain?     Unknown 10. OTHER SYMPTOMS: Do you have any other symptoms? (e.g., dizziness, nausea, vomiting, sweating, fever, difficulty breathing, cough)       denies  Protocols used: Chest Pain-A-AH  "

## 2024-11-25 ENCOUNTER — Ambulatory Visit
Admission: EM | Admit: 2024-11-25 | Discharge: 2024-11-25 | Disposition: A | Discharge status: Home / Self Care | Payer: Self-pay

## 2024-11-25 ENCOUNTER — Ambulatory Visit: Payer: Self-pay

## 2024-11-25 ENCOUNTER — Other Ambulatory Visit: Payer: Self-pay

## 2024-11-25 ENCOUNTER — Encounter: Payer: Self-pay | Admitting: *Deleted

## 2024-11-25 ENCOUNTER — Ambulatory Visit (INDEPENDENT_AMBULATORY_CARE_PROVIDER_SITE_OTHER): Payer: Self-pay

## 2024-11-25 DIAGNOSIS — J209 Acute bronchitis, unspecified: Secondary | ICD-10-CM

## 2024-11-25 DIAGNOSIS — R059 Cough, unspecified: Secondary | ICD-10-CM

## 2024-11-25 MED ORDER — PREDNISONE 50 MG PO TABS: ORAL_TABLET | ORAL | 0 refills | Status: AC

## 2024-11-25 MED ORDER — BENZONATATE 100 MG PO CAPS: 100.0000 mg | ORAL_CAPSULE | Freq: Three times a day (TID) | ORAL | 0 refills | Status: AC

## 2024-11-25 MED ORDER — GUAIFENESIN ER 600 MG PO TB12: 600.0000 mg | ORAL_TABLET | Freq: Two times a day (BID) | ORAL | 0 refills | Status: AC

## 2024-11-25 NOTE — ED Triage Notes (Signed)
 C/O starting with productive cough approx 2 wks ago that is lingering. Now chest and anterior neck soreness. Denies any dyspnea or fevers. C/O occasional HA. Has taken Mucinex  and Nyquil.

## 2024-11-25 NOTE — ED Provider Notes (Signed)
 " [facility]    CSN: [id number] Arrival date & time: 11/25/24  1750      History   Chief Complaint Chief Complaint  Patient presents with   Cough    HPI Garrett George is a 39 y.o. male.   Pt presents today due to 2 weeks of cough productive of clear to green sputum and chest and neck soreness. Pt denies fever, chills, nausea, vomiting, or change in appetite. Pt states that he has been using mucinex  and Nyquil with no relief of symptoms.   The history is provided by the patient.  Cough   Past Medical History:  Diagnosis Date   HTN (hypertension)    Kidney stones    Sleep apnea with use of continuous positive airway pressure (CPAP)     Patient Active Problem List   Diagnosis Date Noted   Ulcerative colitis (HCC) 07/11/2024   Epigastric pain 07/11/2024   LLQ pain 07/11/2024   Chronic rhinitis 03/10/2024   Right shoulder pain 10/15/2023   Encounter for well adult exam with abnormal findings 10/14/2023   Prediabetes 10/14/2023   HTN (hypertension)    Kidney stones    Sleep apnea with use of continuous positive airway pressure (CPAP)    SVT (supraventricular tachycardia) 08/12/2023   OSA (obstructive sleep apnea) 08/12/2023   Deviated septum 08/12/2023   Palpitations 08/12/2023   Nasal turbinate hypertrophy 08/06/2016    Past Surgical History:  Procedure Laterality Date   INGUINAL HERNIA REPAIR Left        Home Medications    Prior to Admission medications  Medication Sig Start Date End Date Taking? Authorizing Provider  Ashwagandha (ASHWAGANDHA 35) 120 MG CAPS Take by mouth daily at 6 (six) AM.   Yes [provider]  benazepril  (LOTENSIN ) 20 MG tablet Take 1 tablet by mouth once daily 10/28/24  Yes Norleen Lynwood ORN, MD  benzonatate  (TESSALON ) 100 MG capsule Take 1 capsule (100 mg total) by mouth every 8 (eight) hours. 11/25/24  Yes Andra Corean BROCKS, PA-C  cholecalciferol (VITAMIN D3) 25 MCG (1000 UNIT) tablet Take 1,000 Units by  mouth daily.   Yes [provider]  guaiFENesin  (MUCINEX ) 600 MG 12 hr tablet Take 1 tablet (600 mg total) by mouth 2 (two) times daily for 10 days. 11/25/24 12/05/24 Yes Andra Corean C, PA-C  predniSONE  (DELTASONE ) 50 MG tablet Take 1 tab po daily for 5 days 11/25/24  Yes Andra Corean C, PA-C  alum & mag hydroxide-simeth (MAALOX MAX) 400-400-40 MG/5ML suspension Take 10 mLs by mouth every 6 (six) hours as needed for indigestion. 07/02/24   Trine Raynell Moder, MD  meloxicam  (MOBIC ) 15 MG tablet Take 1 tablet (15 mg total) by mouth daily. 10/16/23   Leonce Katz, DO  pantoprazole  (PROTONIX ) 40 MG tablet Take 1 tablet (40 mg total) by mouth daily. 07/10/24   Norleen Lynwood ORN, MD  sucralfate  (CARAFATE ) 1 g tablet Take 1 tablet (1 g total) by mouth 4 (four) times daily. 07/10/24   Norleen Lynwood ORN, MD  sulfaSALAzine (AZULFIDINE) 500 MG tablet Take 1,000 mg by mouth 2 (two) times daily. 07/09/24   [provider]    Family History Family History  Problem Relation Age of Onset   Diabetes Mother    Hypertension Mother    High Cholesterol Mother    Diabetes Father    Kidney disease Father    Hypertension Father    High Cholesterol Father    Heart attack Brother  Stroke Brother    Colon cancer Maternal Grandfather    Lung cancer Maternal Uncle     Social History Social History[1]   Allergies   Pollen extract   Review of Systems Review of Systems  Respiratory:  Positive for cough.      Physical Exam Triage Vital Signs ED Triage Vitals  Encounter Vitals Group     BP 11/25/24 1758 (!) 149/91     Girls Systolic BP Percentile --      Girls Diastolic BP Percentile --      Boys Systolic BP Percentile --      Boys Diastolic BP Percentile --      Pulse Rate 11/25/24 1758 (!) 106     Resp 11/25/24 1758 18     Temp 11/25/24 1758 98.8 F (37.1 C)     Temp Source 11/25/24 1758 Oral     SpO2 11/25/24 1758 98 %     Weight --      Height --      Head  Circumference --      Peak Flow --      Pain Score 11/25/24 1800 5     Pain Loc --      Pain Education --      Exclude from Growth Chart --    No data found.  Updated Vital Signs BP (!) 149/91   Pulse (!) 106   Temp 98.8 F (37.1 C) (Oral)   Resp 18   SpO2 98%   Visual Acuity Right Eye Distance:   Left Eye Distance:   Bilateral Distance:    Right Eye Near:   Left Eye Near:    Bilateral Near:     Physical Exam Vitals and nursing note reviewed.  Constitutional:      General: He is not in acute distress.    Appearance: Normal appearance. He is not ill-appearing, toxic-appearing or diaphoretic.  Eyes:     General: No scleral icterus. Cardiovascular:     Rate and Rhythm: Normal rate and regular rhythm.     Heart sounds: Normal heart sounds.  Pulmonary:     Effort: Pulmonary effort is normal. No respiratory distress.     Breath sounds: Normal breath sounds. No wheezing or rhonchi.  Skin:    General: Skin is warm.  Neurological:     Mental Status: He is alert and oriented to person, place, and time.  Psychiatric:        Mood and Affect: Mood normal.        Behavior: Behavior normal.      UC Treatments / Results  Labs (all labs ordered are listed, but only abnormal results are displayed) Labs Reviewed - No data to display  EKG   Radiology DG Chest 2 View Result Date: 11/25/2024 CLINICAL DATA:  Productive cough for 2 weeks EXAM: DG CHEST 2V COMPARISON:  05/13/2023 FINDINGS: The heart size and mediastinal contours are within normal limits. Both lungs are clear. The visualized skeletal structures are unremarkable. IMPRESSION: No active cardiopulmonary disease. Electronically Signed   By: Ozell Daring M.D.   On: 11/25/2024 18:33    Procedures Procedures (including critical care time)  Medications Ordered in UC Medications - No data to display  Initial Impression / Assessment and Plan / UC Course  I have reviewed the triage vital signs and the nursing  notes.  Pertinent labs & imaging results that were available during my care of the patient were reviewed by me and considered in my medical  decision making (see chart for details).     Final Clinical Impressions(s) / UC Diagnoses   Final diagnoses:  Cough, unspecified type  Acute bronchitis, unspecified organism   Discharge Instructions   None    ED Prescriptions     Medication Sig Dispense Auth. Provider   benzonatate  (TESSALON ) 100 MG capsule Take 1 capsule (100 mg total) by mouth every 8 (eight) hours. 30 capsule Andra Corean BROCKS, PA-C   guaiFENesin  (MUCINEX ) 600 MG 12 hr tablet Take 1 tablet (600 mg total) by mouth 2 (two) times daily for 10 days. 20 tablet Oneal Schoenberger C, PA-C   predniSONE  (DELTASONE ) 50 MG tablet Take 1 tab po daily for 5 days 5 tablet Andra Corean BROCKS, PA-C      PDMP not reviewed this encounter.    [1]  Social History Tobacco Use   Smoking status: Never   Smokeless tobacco: Never  Vaping Use   Vaping status: Never Used  Substance Use Topics   Alcohol use: Not Currently   Drug use: No     Andra Corean BROCKS DEVONNA 11/25/24 1857  "

## 2024-12-11 ENCOUNTER — Encounter: Payer: Self-pay | Admitting: Internal Medicine

## 2024-12-15 ENCOUNTER — Inpatient Hospital Stay: Payer: Self-pay | Admitting: Family Medicine

## 2024-12-16 ENCOUNTER — Encounter: Payer: Self-pay | Admitting: Emergency Medicine

## 2024-12-16 ENCOUNTER — Ambulatory Visit: Payer: Self-pay | Admitting: Emergency Medicine

## 2024-12-16 VITALS — BP 120/70 | HR 87 | Temp 98.8°F | Ht 69.0 in | Wt 176.0 lb

## 2024-12-16 DIAGNOSIS — R7303 Prediabetes: Secondary | ICD-10-CM

## 2024-12-16 DIAGNOSIS — G4730 Sleep apnea, unspecified: Secondary | ICD-10-CM

## 2024-12-16 DIAGNOSIS — I10 Essential (primary) hypertension: Secondary | ICD-10-CM

## 2024-12-16 DIAGNOSIS — R079 Chest pain, unspecified: Secondary | ICD-10-CM | POA: Insufficient documentation

## 2024-12-16 DIAGNOSIS — K5190 Ulcerative colitis, unspecified, without complications: Secondary | ICD-10-CM

## 2024-12-16 NOTE — Assessment & Plan Note (Signed)
 Diet and nutrition discussed Lab Results  Component Value Date   HGBA1C 5.6 10/14/2023

## 2024-12-16 NOTE — Assessment & Plan Note (Signed)
 Presently on sulfasalazine 1000 mg twice a day Follows up with GI on a regular basis

## 2024-12-16 NOTE — Assessment & Plan Note (Signed)
 BP Readings from Last 3 Encounters:  12/16/24 120/70  11/25/24 (!) 149/91  07/10/24 118/82  Well-controlled hypertension Cardiovascular risks associated with hypertension discussed Continue Lotensin  20 mg daily 120/70

## 2024-12-16 NOTE — Assessment & Plan Note (Signed)
 Well controlled with CPAP

## 2024-12-16 NOTE — Progress Notes (Signed)
 Garrett George Candy 39 y.o.   Chief Complaint  Patient presents with   Chest Pain    Pt reports chest pains for years. PT went to hospital due to bronchitis. Was prescribed mediation and has cleared since but pain and discomfort still present. Pt reports pain when breathing and states he sometimes feel shortness of breath.    HISTORY OF PRESENT ILLNESS: Acute problem visit today. This is a 39 y.o. male here for follow-up of emergency department visit on 12/04/2024 when he presented with chest pain He has been having intermittent left upper chest pain for couple years History of hypertension and ulcerative colitis.  Also history of obstructive sleep apnea. Non-smoker.  No history of diabetes. Workup in the emergency department was negative. No other complaints or medical concerns today Describes pain as sharp and worse on palpation/movement.  Not associated with any other symptoms Comes and goes.  Some days are better than others. Still able to workout.  Pain has not really interfered with his activities of daily living.  Chest Pain  Pertinent negatives include no abdominal pain, cough, dizziness, fever, headaches, nausea, palpitations, shortness of breath or vomiting.     Prior to Admission medications  Medication Sig Start Date End Date Taking? Authorizing Provider  alum & mag hydroxide-simeth (MAALOX MAX) 400-400-40 MG/5ML suspension Take 10 mLs by mouth every 6 (six) hours as needed for indigestion. 07/02/24  Yes Cardama, Raynell Moder, MD  Ashwagandha (ASHWAGANDHA 35) 120 MG CAPS Take by mouth daily at 6 (six) AM.   Yes [provider]  benazepril  (LOTENSIN ) 20 MG tablet Take 1 tablet by mouth once daily 10/28/24  Yes Norleen Lynwood ORN, MD  benzonatate  (TESSALON ) 100 MG capsule Take 1 capsule (100 mg total) by mouth every 8 (eight) hours. 11/25/24  Yes Andra Corean BROCKS, PA-C  cholecalciferol (VITAMIN D3) 25 MCG (1000 UNIT) tablet Take 1,000 Units by mouth daily.   Yes [provider]  meloxicam  (MOBIC ) 15 MG tablet Take 1 tablet (15 mg total) by mouth daily. 10/16/23  Yes Leonce Katz, DO  pantoprazole  (PROTONIX ) 40 MG tablet Take 1 tablet (40 mg total) by mouth daily. 07/10/24  Yes Norleen Lynwood ORN, MD  predniSONE  (DELTASONE ) 50 MG tablet Take 1 tab po daily for 5 days 11/25/24  Yes Andra Corean C, PA-C  sucralfate  (CARAFATE ) 1 g tablet Take 1 tablet (1 g total) by mouth 4 (four) times daily. 07/10/24  Yes Norleen Lynwood ORN, MD  sulfaSALAzine (AZULFIDINE) 500 MG tablet Take 1,000 mg by mouth 2 (two) times daily. 07/09/24  Yes [provider]    Allergies[1]  Patient Active Problem List   Diagnosis Date Noted   Ulcerative colitis (HCC) 07/11/2024   Epigastric pain 07/11/2024   LLQ pain 07/11/2024   Chronic rhinitis 03/10/2024   Right shoulder pain 10/15/2023   Encounter for well adult exam with abnormal findings 10/14/2023   Prediabetes 10/14/2023   HTN (hypertension)    Kidney stones    Sleep apnea with use of continuous positive airway pressure (CPAP)    SVT (supraventricular tachycardia) 08/12/2023   OSA (obstructive sleep apnea) 08/12/2023   Deviated septum 08/12/2023   Palpitations 08/12/2023   Nasal turbinate hypertrophy 08/06/2016    Past Medical History:  Diagnosis Date   HTN (hypertension)    Kidney stones    Sleep apnea with use of continuous positive airway pressure (CPAP)     Past Surgical History:  Procedure Laterality Date   INGUINAL HERNIA REPAIR Left  Social History   Socioeconomic History   Marital status: Single    Spouse name: Not on file   Number of children: 0   Years of education: Not on file   Highest education level: Not on file  Occupational History   Occupation: self employed  Tobacco Use   Smoking status: Never   Smokeless tobacco: Never  Vaping Use   Vaping status: Never Used  Substance and Sexual Activity   Alcohol use: Not Currently   Drug use: No   Sexual activity: Never  Other  Topics Concern   Not on file  Social History Narrative   Not on file   Social Drivers of Health   Tobacco Use: Low Risk (12/16/2024)   Patient History    Smoking Tobacco Use: Never    Smokeless Tobacco Use: Never    Passive Exposure: Not on file  Financial Resource Strain: Not on file  Food Insecurity: Not on file  Transportation Needs: Not on file  Physical Activity: Not on file  Stress: Not on file  Social Connections: Not on file  Intimate Partner Violence: Not At Risk (12/04/2024)   Received from [facility]   HITS    Over the last 12 months how often did your partner physically hurt you?: Never    Over the last 12 months how often did your partner insult you or talk down to you?: Never    Over the last 12 months how often did your partner threaten you with physical harm?: Never    Over the last 12 months how often did your partner scream or curse at you?: Never  Depression (PHQ2-9): High Risk (10/14/2023)   Depression (PHQ2-9)    PHQ-2 Score: 12  Alcohol Screen: Not on file  Housing: Not on file  Utilities: Not on file  Health Literacy: Not on file    Family History  Problem Relation Age of Onset   Diabetes Mother    Hypertension Mother    High Cholesterol Mother    Diabetes Father    Kidney disease Father    Hypertension Father    High Cholesterol Father    Heart attack Brother    Stroke Brother    Colon cancer Maternal Grandfather    Lung cancer Maternal Uncle      Review of Systems  Constitutional: Negative.  Negative for chills and fever.  HENT: Negative.  Negative for congestion and sore throat.   Respiratory: Negative.  Negative for cough and shortness of breath.   Cardiovascular:  Positive for chest pain. Negative for palpitations.  Gastrointestinal:  Negative for abdominal pain, diarrhea, nausea and vomiting.  Genitourinary: Negative.  Negative for dysuria and hematuria.  Skin: Negative.  Negative for rash.  Neurological: Negative.  Negative for  dizziness and headaches.  All other systems reviewed and are negative.   Today's Vitals   12/16/24 1412  BP: (!) 122/92  Pulse: 87  Temp: 98.8 F (37.1 C)  TempSrc: Temporal  SpO2: 99%  Weight: 176 lb (79.8 kg)  Height: 5' 9 (1.753 m)   Body mass index is 25.99 kg/m.   Physical Exam Vitals reviewed.  Constitutional:      Appearance: Normal appearance.  HENT:     Head: Normocephalic.     Mouth/Throat:     Mouth: Mucous membranes are moist.     Pharynx: Oropharynx is clear.  Eyes:     Extraocular Movements: Extraocular movements intact.     Conjunctiva/sclera: Conjunctivae normal.  Pupils: Pupils are equal, round, and reactive to light.  Cardiovascular:     Rate and Rhythm: Normal rate and regular rhythm.     Pulses: Normal pulses.     Heart sounds: Normal heart sounds.  Pulmonary:     Effort: Pulmonary effort is normal.     Breath sounds: Normal breath sounds.  Abdominal:     Palpations: Abdomen is soft.     Tenderness: There is no abdominal tenderness.  Musculoskeletal:     Cervical back: No tenderness.  Lymphadenopathy:     Cervical: No cervical adenopathy.  Skin:    General: Skin is warm and dry.     Capillary Refill: Capillary refill takes less than 2 seconds.  Neurological:     General: No focal deficit present.     Mental Status: He is alert and oriented to person, place, and time.  Psychiatric:        Mood and Affect: Mood normal.        Behavior: Behavior normal.      ASSESSMENT & PLAN: A total of 40 minutes was spent with the patient and counseling/coordination of care regarding preparing for this visit, review of most recent office visit notes, review of multiple chronic medical conditions and their management, differential diagnosis of nonspecific chest pain and need for workup including CT scan of chest, review of all medications, review of most recent bloodwork results, review of health maintenance items, education on nutrition, prognosis,  documentation, and need for follow up.   Problem List Items Addressed This Visit       Cardiovascular and Mediastinum   HTN (hypertension)   BP Readings from Last 3 Encounters:  12/16/24 120/70  11/25/24 (!) 149/91  07/10/24 118/82  Well-controlled hypertension Cardiovascular risks associated with hypertension discussed Continue Lotensin  20 mg daily 120/70         Respiratory   Sleep apnea with use of continuous positive airway pressure (CPAP)   Well-controlled with CPAP        Digestive   Ulcerative colitis (HCC)   Presently on sulfasalazine 1000 mg twice a day Follows up with GI on a regular basis        Other   Prediabetes   Diet and nutrition discussed Lab Results  Component Value Date   HGBA1C 5.6 10/14/2023         Nonspecific chest pain - Primary   Clinically stable.  No red flag signs or symptoms Normal most recent EKG Normal most recent chest x-ray Unremarkable exam today History hypertension.  No significant family history for CAD. Non-smoker. Low risk for CAD. Recommend CT cardiac score Needs follow-up with PCP as soon as available ED precautions given Pain management discussed      Relevant Orders   CT CARDIAC SCORING (SELF PAY ONLY)   Patient Instructions  Chest Pain Without a Known Cause (Nonspecific Chest Pain) in Adults: What It Means Chest pain can be caused by many different conditions. Some causes of chest pain can be life-threatening. These will require treatment right away. Serious causes of chest pain include: Heart attack. A tear in the body's main blood vessel. Redness and swelling (inflammation) around your heart. Blood clot in your lungs. Other causes of chest pain may not be so serious. These include: Heartburn. Anxiety or stress. Damage to bones or muscles in your chest. Lung infections. Chest pain can feel like: Pain or discomfort in your chest. Crushing, pressure, aching, or squeezing pain. Burning or  tingling. Dull or sharp  pain that is worse when you move, cough, or take a deep breath. Pain or discomfort that is also felt in your back, neck, jaw, shoulder, or arm, or pain that spreads to any of these areas. It is hard to know whether your pain is caused by something that is serious or something that is not so serious. So it is important to see your doctor right away if you have chest pain. Follow these instructions at home: Medicines Take over-the-counter and prescription medicines only as told by your doctor. If you were prescribed an antibiotic medicine, take it as told by your doctor. Do not stop taking the antibiotic even if you start to feel better. Lifestyle  Rest as told by your doctor. Do not use any products that contain nicotine or tobacco, such as cigarettes, e-cigarettes, and chewing tobacco. If you need help quitting, ask your doctor. Do not drink alcohol. Make lifestyle changes as told by your doctor. These may include: Getting regular exercise. Ask your doctor what activities are safe for you. Eating a heart-healthy diet. A diet and nutrition specialist (dietitian) can help you to learn healthy eating options. Staying at a healthy weight. Treating diabetes or high blood pressure, if needed. Lowering your stress. Activities such as yoga and relaxation techniques can help. General instructions Pay attention to any changes in your symptoms. Tell your doctor about them or any new symptoms. Avoid any activities that cause chest pain. Keep all follow-up visits as told by your doctor. This is important. You may need more testing if your chest pain does not go away. Contact a doctor if: Your chest pain does not go away. You feel depressed. You have a fever. Get help right away if: Your chest pain is worse. You have a cough that gets worse, or you cough up blood. You have very bad (severe) pain in your belly (abdomen). You pass out (faint). You have either of these for no  clear reason: Sudden chest discomfort. Sudden discomfort in your arms, back, neck, or jaw. You have shortness of breath at any time. You suddenly start to sweat, or your skin gets clammy. You feel sick to your stomach (nauseous). You throw up (vomit). You suddenly feel lightheaded or dizzy. You feel very weak or tired. Your heart starts to beat fast, or it feels like it is skipping beats. These symptoms may be an emergency. Do not wait to see if the symptoms will go away. Get medical help right away. Call your local emergency services (911 in the [place] Do not drive yourself to the hospital. Summary Chest pain can be caused by many different conditions. The cause may be serious and need treatment right away. If you have chest pain, see your doctor right away. Follow your doctor's instructions for taking medicines and making lifestyle changes. Keep all follow-up visits as told by your doctor. This includes visits for any further testing if your chest pain does not go away. Be sure to know the signs that show that your condition has become worse. Get help right away if you have these symptoms. This information is not intended to replace advice given to you by your health care provider. Make sure you discuss any questions you have with your health care provider. Document Revised: 09/09/2024 Document Reviewed: 09/13/2022 Elsevier Patient Education  2025 [employer]    Emil Schaumann, MD [facility]     [1]  Allergies Allergen Reactions   Pollen Extract Itching  Other reaction(s): Cough

## 2024-12-16 NOTE — Patient Instructions (Signed)
 Chest Pain Without a Known Cause (Nonspecific Chest Pain) in Adults: What It Means Chest pain can be caused by many different conditions. Some causes of chest pain can be life-threatening. These will require treatment right away. Serious causes of chest pain include: Heart attack. A tear in the body's main blood vessel. Redness and swelling (inflammation) around your heart. Blood clot in your lungs. Other causes of chest pain may not be so serious. These include: Heartburn. Anxiety or stress. Damage to bones or muscles in your chest. Lung infections. Chest pain can feel like: Pain or discomfort in your chest. Crushing, pressure, aching, or squeezing pain. Burning or tingling. Dull or sharp pain that is worse when you move, cough, or take a deep breath. Pain or discomfort that is also felt in your back, neck, jaw, shoulder, or arm, or pain that spreads to any of these areas. It is hard to know whether your pain is caused by something that is serious or something that is not so serious. So it is important to see your doctor right away if you have chest pain. Follow these instructions at home: Medicines Take over-the-counter and prescription medicines only as told by your doctor. If you were prescribed an antibiotic medicine, take it as told by your doctor. Do not stop taking the antibiotic even if you start to feel better. Lifestyle  Rest as told by your doctor. Do not use any products that contain nicotine or tobacco, such as cigarettes, e-cigarettes, and chewing tobacco. If you need help quitting, ask your doctor. Do not drink alcohol. Make lifestyle changes as told by your doctor. These may include: Getting regular exercise. Ask your doctor what activities are safe for you. Eating a heart-healthy diet. A diet and nutrition specialist (dietitian) can help you to learn healthy eating options. Staying at a healthy weight. Treating diabetes or high blood pressure, if needed. Lowering  your stress. Activities such as yoga and relaxation techniques can help. General instructions Pay attention to any changes in your symptoms. Tell your doctor about them or any new symptoms. Avoid any activities that cause chest pain. Keep all follow-up visits as told by your doctor. This is important. You may need more testing if your chest pain does not go away. Contact a doctor if: Your chest pain does not go away. You feel depressed. You have a fever. Get help right away if: Your chest pain is worse. You have a cough that gets worse, or you cough up blood. You have very bad (severe) pain in your belly (abdomen). You pass out (faint). You have either of these for no clear reason: Sudden chest discomfort. Sudden discomfort in your arms, back, neck, or jaw. You have shortness of breath at any time. You suddenly start to sweat, or your skin gets clammy. You feel sick to your stomach (nauseous). You throw up (vomit). You suddenly feel lightheaded or dizzy. You feel very weak or tired. Your heart starts to beat fast, or it feels like it is skipping beats. These symptoms may be an emergency. Do not wait to see if the symptoms will go away. Get medical help right away. Call your local emergency services (911 in the [place] Do not drive yourself to the hospital. Summary Chest pain can be caused by many different conditions. The cause may be serious and need treatment right away. If you have chest pain, see your doctor right away. Follow your doctor's instructions for taking medicines and making lifestyle changes. Keep all follow-up visits  as told by your doctor. This includes visits for any further testing if your chest pain does not go away. Be sure to know the signs that show that your condition has become worse. Get help right away if you have these symptoms. This information is not intended to replace advice given to you by your health care provider. Make sure you discuss any questions you  have with your health care provider. Document Revised: 09/09/2024 Document Reviewed: 09/13/2022 Elsevier Patient Education  2025 [employer].

## 2024-12-16 NOTE — Assessment & Plan Note (Signed)
 Clinically stable.  No red flag signs or symptoms Normal most recent EKG Normal most recent chest x-ray Unremarkable exam today History hypertension.  No significant family history for CAD. Non-smoker. Low risk for CAD. Recommend CT cardiac score Needs follow-up with PCP as soon as available ED precautions given Pain management discussed

## 2024-12-18 ENCOUNTER — Inpatient Hospital Stay: Payer: Self-pay | Admitting: Family Medicine

## 2024-12-24 ENCOUNTER — Other Ambulatory Visit (HOSPITAL_BASED_OUTPATIENT_CLINIC_OR_DEPARTMENT_OTHER): Payer: Self-pay
